# Patient Record
Sex: Male | Born: 1965 | Race: White | Hispanic: No | Marital: Married | Smoking: Never smoker
Health system: Southern US, Community
[De-identification: ages and names within clinical notes are randomized; demographics above are authoritative.]

## PROBLEM LIST (undated history)

## (undated) DIAGNOSIS — I82409 Acute embolism and thrombosis of unspecified deep veins of unspecified lower extremity: Secondary | ICD-10-CM

## (undated) DIAGNOSIS — G8929 Other chronic pain: Secondary | ICD-10-CM

## (undated) DIAGNOSIS — J690 Pneumonitis due to inhalation of food and vomit: Secondary | ICD-10-CM

## (undated) DIAGNOSIS — N200 Calculus of kidney: Secondary | ICD-10-CM

## (undated) DIAGNOSIS — I2699 Other pulmonary embolism without acute cor pulmonale: Secondary | ICD-10-CM

## (undated) HISTORY — PX: ABDOMINAL SURGERY: SHX537

## (undated) HISTORY — PX: KIDNEY STONE SURGERY: SHX686

## (undated) HISTORY — PX: GASTRIC BYPASS: SHX52

## (undated) HISTORY — PX: KNEE SURGERY: SHX244

---

## 1999-09-19 ENCOUNTER — Encounter: Payer: Self-pay | Admitting: Family Medicine

## 1999-09-19 ENCOUNTER — Encounter: Admission: RE | Admit: 1999-09-19 | Discharge: 1999-09-19 | Payer: Self-pay | Admitting: Family Medicine

## 1999-09-28 ENCOUNTER — Encounter: Admission: RE | Admit: 1999-09-28 | Discharge: 1999-09-28 | Payer: Self-pay | Admitting: Family Medicine

## 1999-09-28 ENCOUNTER — Encounter: Payer: Self-pay | Admitting: Family Medicine

## 2008-05-11 ENCOUNTER — Emergency Department (HOSPITAL_BASED_OUTPATIENT_CLINIC_OR_DEPARTMENT_OTHER): Admission: EM | Admit: 2008-05-11 | Discharge: 2008-05-11 | Payer: Self-pay | Admitting: Emergency Medicine
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
## 2010-01-06 ENCOUNTER — Emergency Department (HOSPITAL_BASED_OUTPATIENT_CLINIC_OR_DEPARTMENT_OTHER): Admission: EM | Admit: 2010-01-06 | Discharge: 2010-01-07 | Payer: Self-pay | Admitting: Emergency Medicine

## 2010-01-06 IMAGING — CT CT ABD-PELV W/ CM
2 of 5 series · 16 of 46 positions shown, 18 images · IV contrast (omnipaque)
Comparison: Today's plain films.

CLINICAL DATA: Abdominal pain, vomiting.

CT ABDOMEN AND PELVIS WITH CONTRAST
TECHNIQUE: Multidetector CT imaging of the abdomen and pelvis was
performed following the standard protocol during bolus
administration of intravenous contrast.
Contrast: 100 ml Omnipaque 300 IV.

[Series 2: abd/pelvis 5.0 b31f · axial · 0.81mm/px · z∈[-500,-40]mm · 13 of 106 slices shown, 15 images]
[im 7/106  soft-tissue]
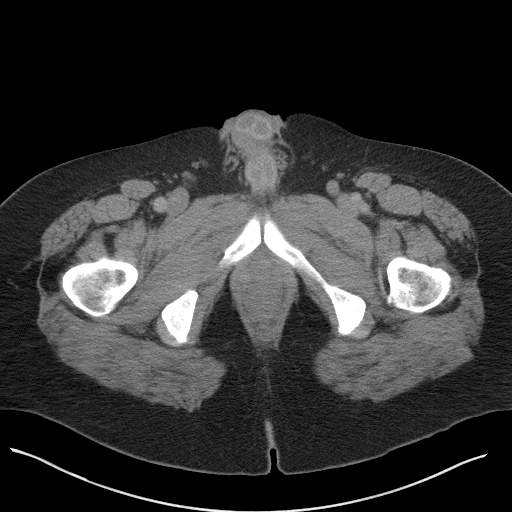
[im 7/106  bone]
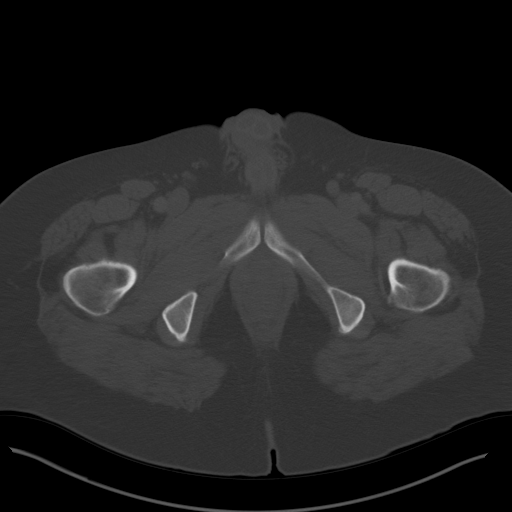
[im 13/106  soft-tissue]
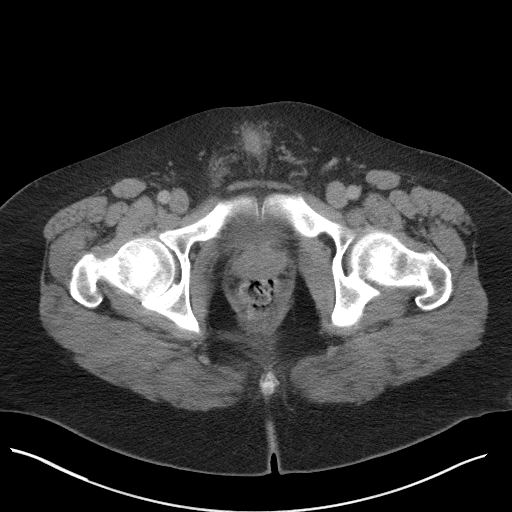
[im 25/106  soft-tissue]
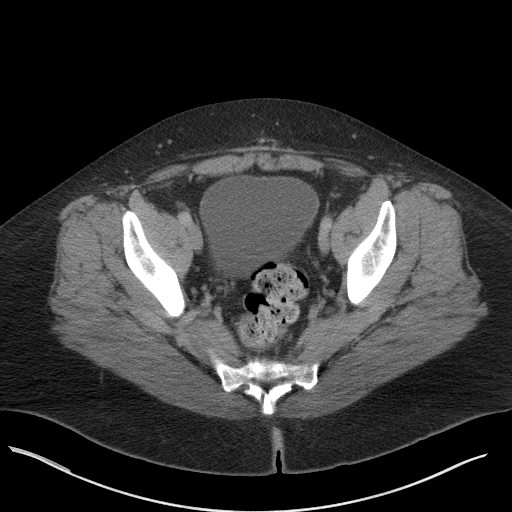
[im 31/106  soft-tissue]
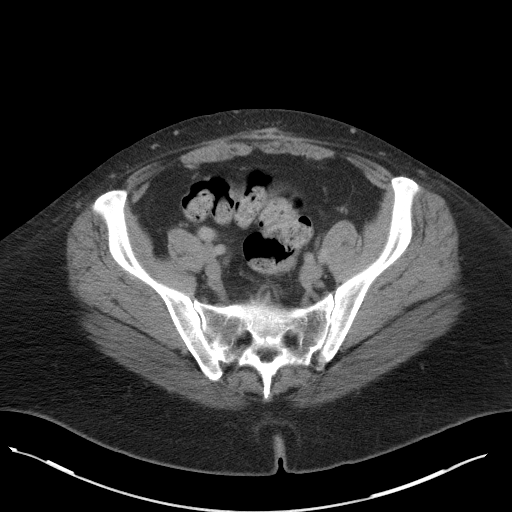
[im 38/106  soft-tissue]
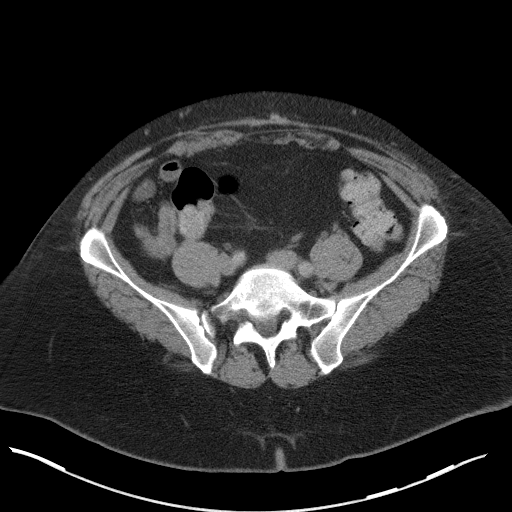
[im 44/106  soft-tissue]
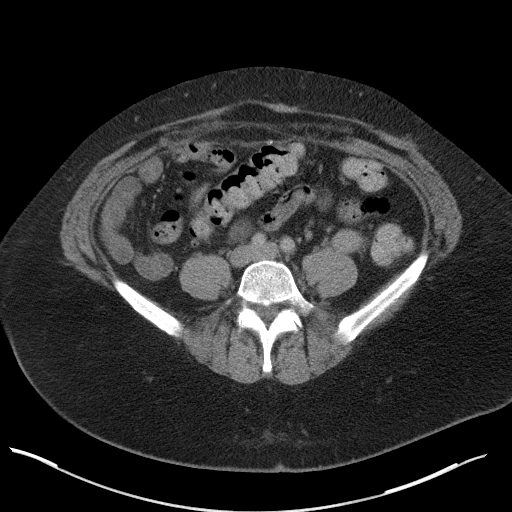
[im 56/106  soft-tissue]
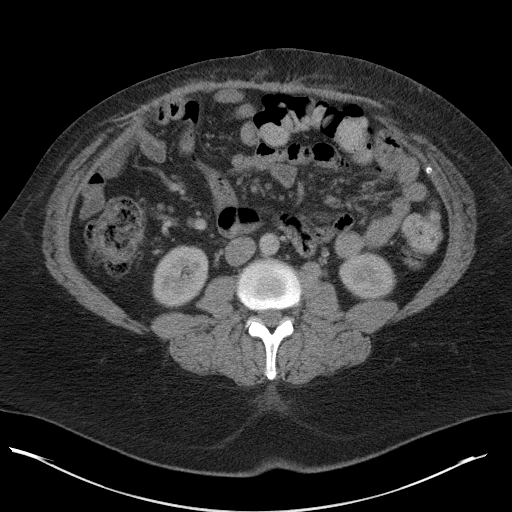
[im 62/106  soft-tissue]
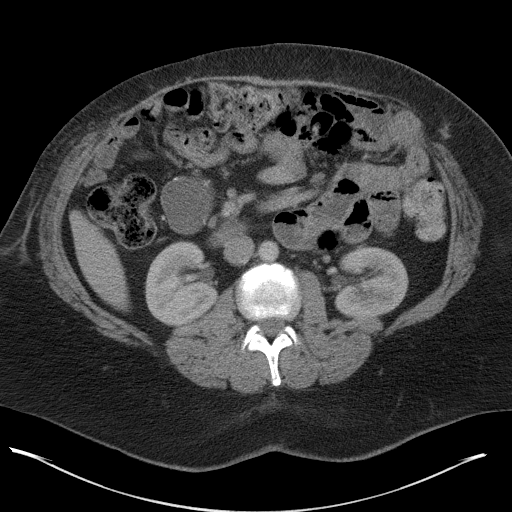
[im 68/106  soft-tissue]
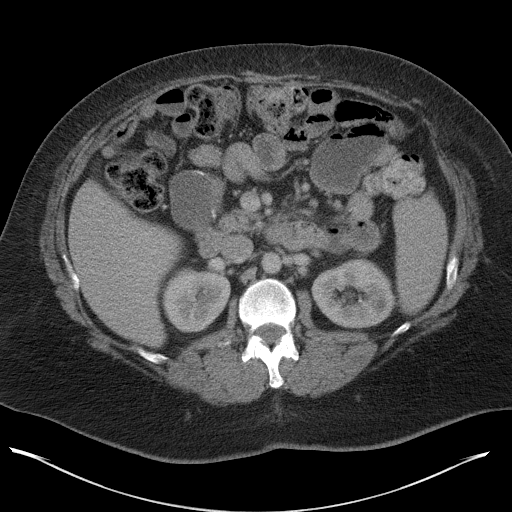
[im 68/106  bone]
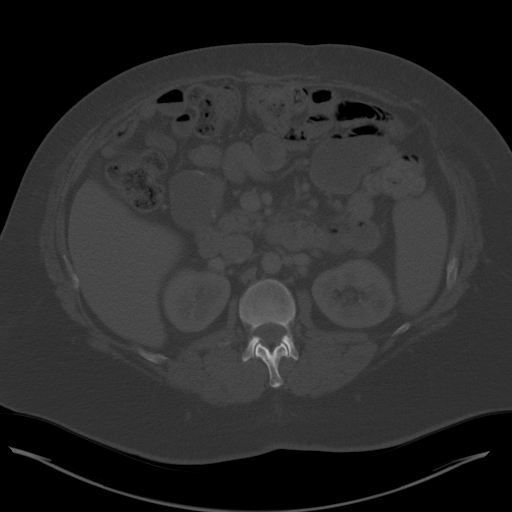
[im 75/106  soft-tissue]
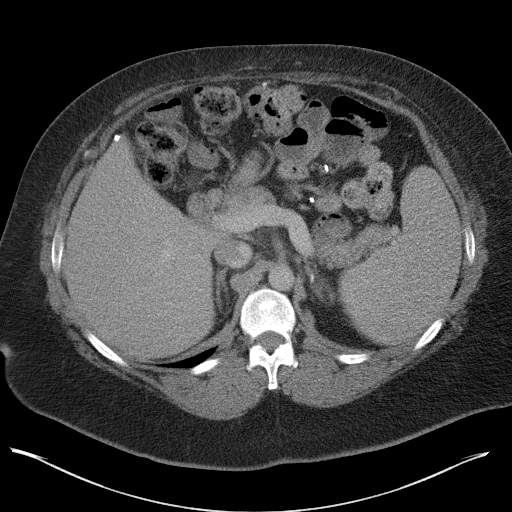
[im 81/106  soft-tissue]
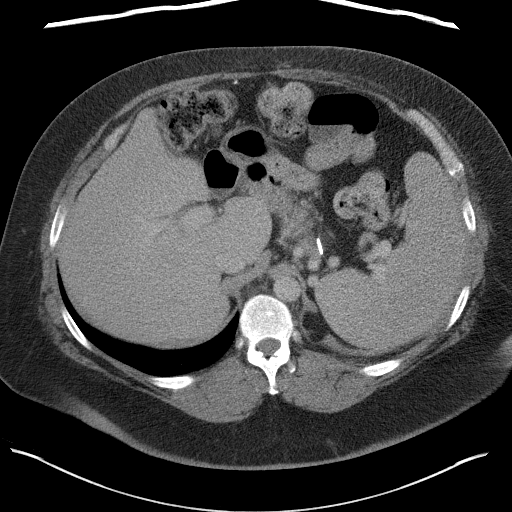
[im 93/106  soft-tissue]
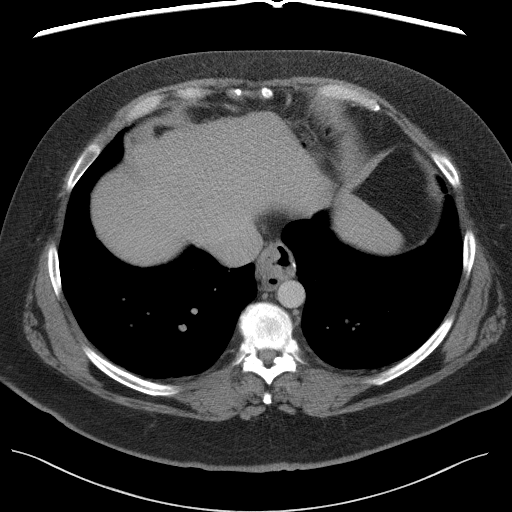
[im 99/106  soft-tissue]
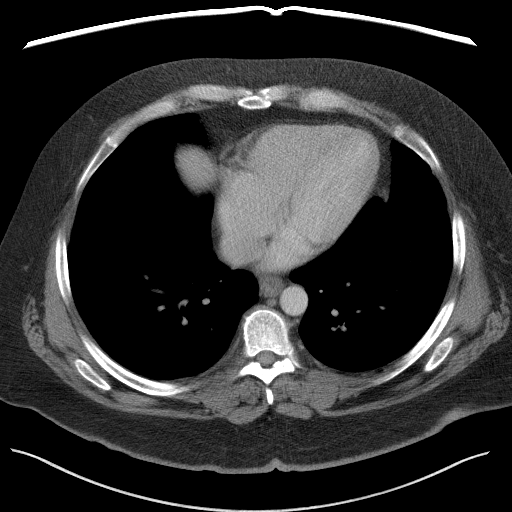

[Series 5: abd/pelvis 3.0 coronal · coronal · 0.91mm/px · 3 of 103 slices shown]
[im 35/103  soft-tissue]
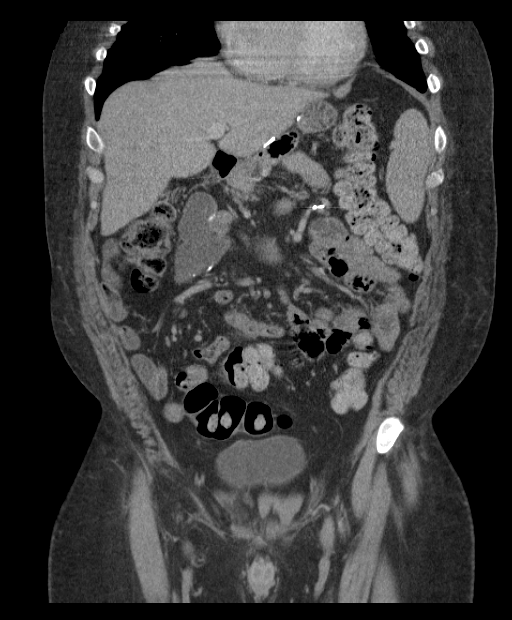
[im 46/103  soft-tissue]
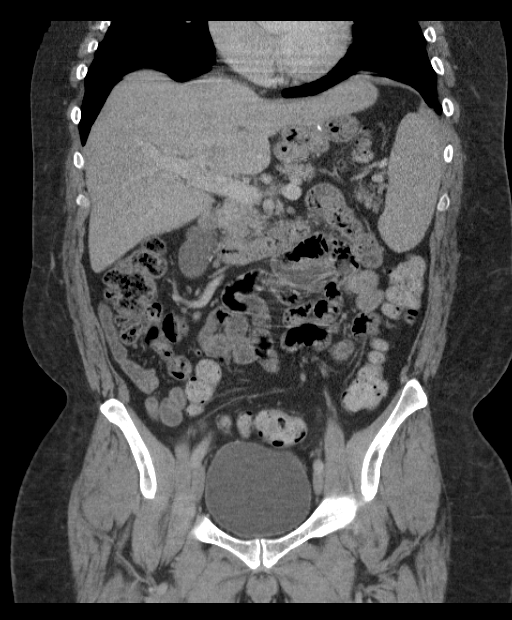
[im 57/103  soft-tissue]
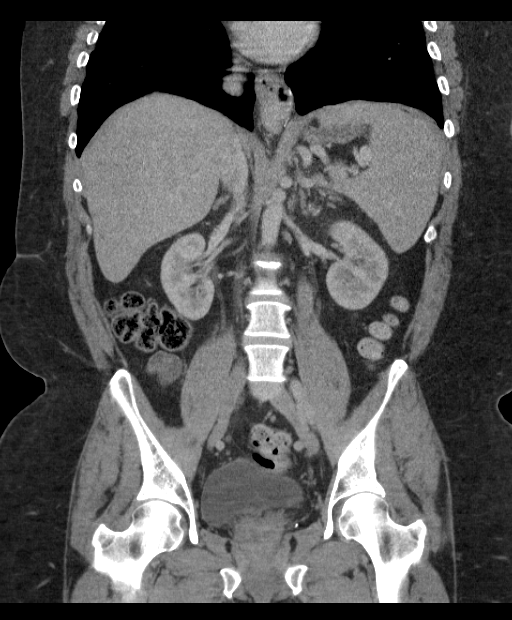

[16 of 46 positions shown; findings below may reference images not displayed]

FINDINGS: Patchy ground-glass and tree in bud densities are noted
in the lower lobes may reflect mild small airways
disease/alveolitis.  No effusions.  Heart is normal size.

Abdomen:  Prior cholecystectomy.  Postoperative changes noted in
the small bowel as well.  There is a suture line in the right
abdomen within a small bowel loop which is mildly dilated, but no
evidence of obstruction.  Liver, spleen, pancreas, adrenals,
kidneys unremarkable.  No free fluid, free air or adenopathy.

Pelvis:  Appendix is visualized and is normal. Bowel grossly
unremarkable.  No free fluid, free air, or adenopathy. Bladder,
seminal vesicles, prostate unremarkable.

No acute bony abnormality.
IMPRESSION: No acute findings in the abdomen or pelvis.

Patchy ground-glass and tree in bud densities in the lower lobes
which may reflect small airways disease/alveolitis.

## 2010-01-06 IMAGING — CR DG ABDOMEN ACUTE W/ 1V CHEST
5 series · 5 of 5 positions shown · non-contrast
Comparison: None

CLINICAL DATA: Abdominal pain, vomiting.

ACUTE ABDOMEN SERIES (ABDOMEN 2 VIEW & CHEST 1 VIEW)

[w chest pa *]
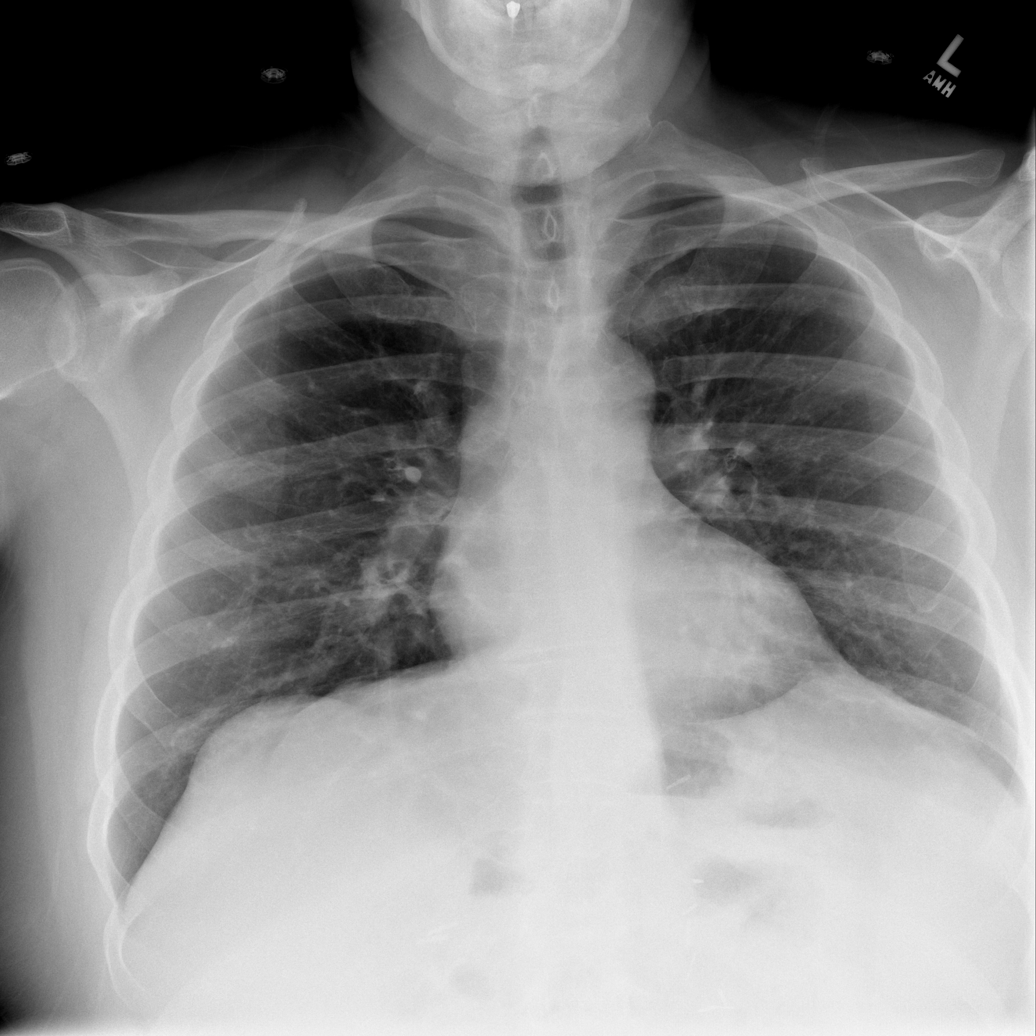

[w abdomen upright * (1 of 2)]
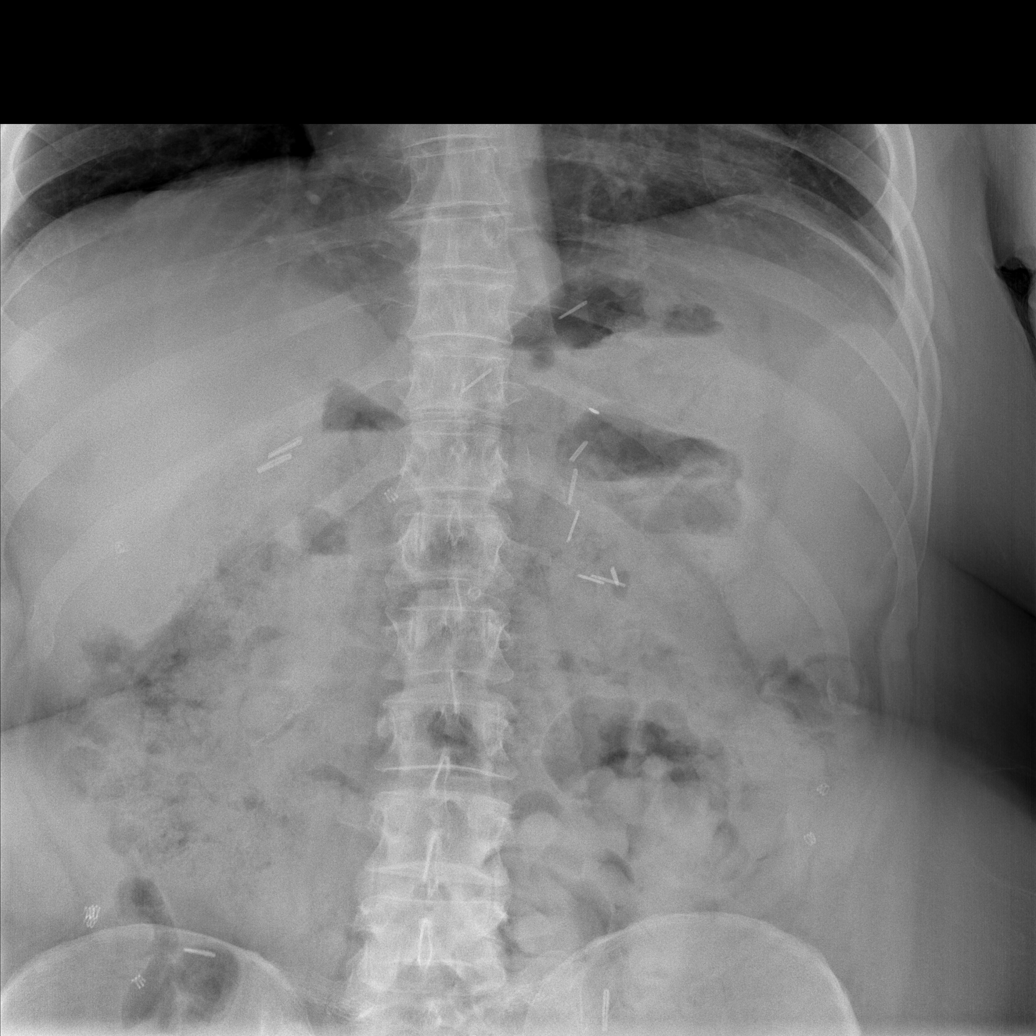

[w abdomen upright * (2 of 2)]
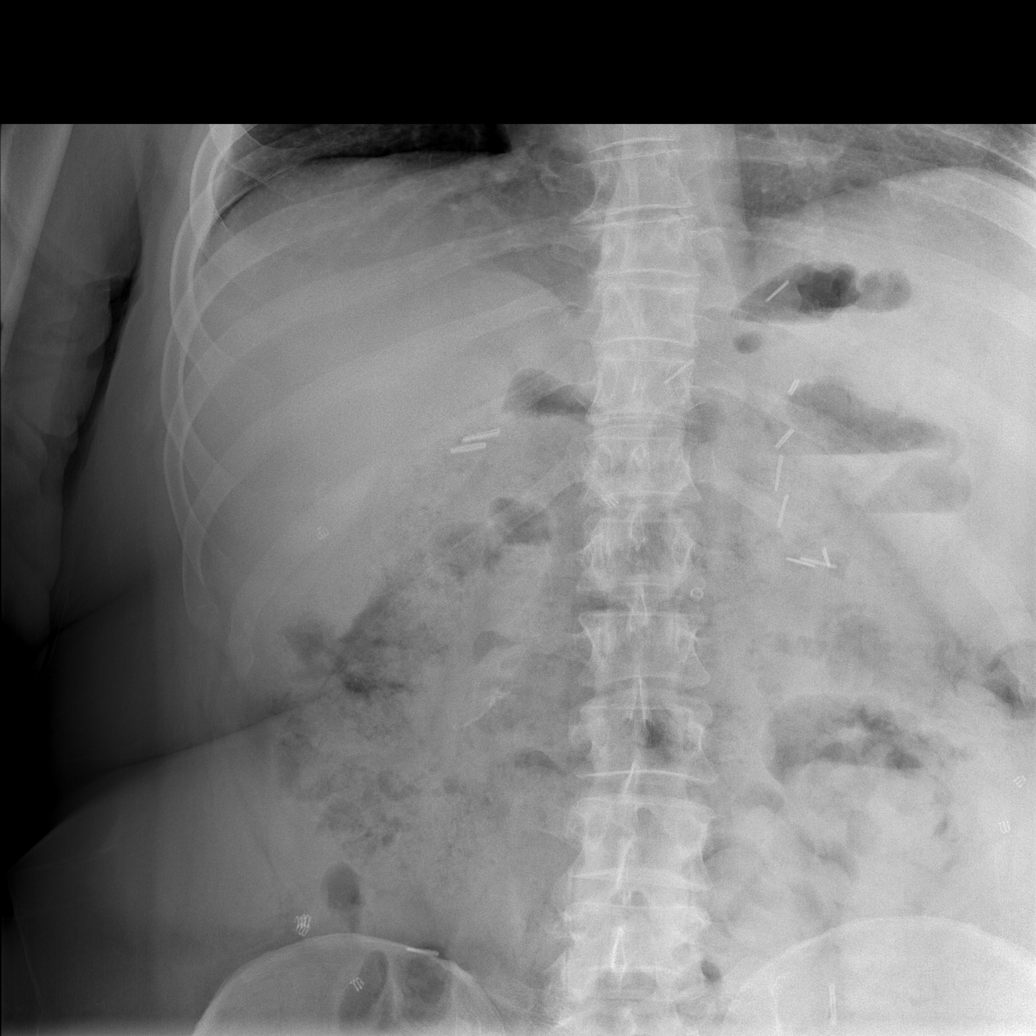

[t abdomen supine * (1 of 2)]
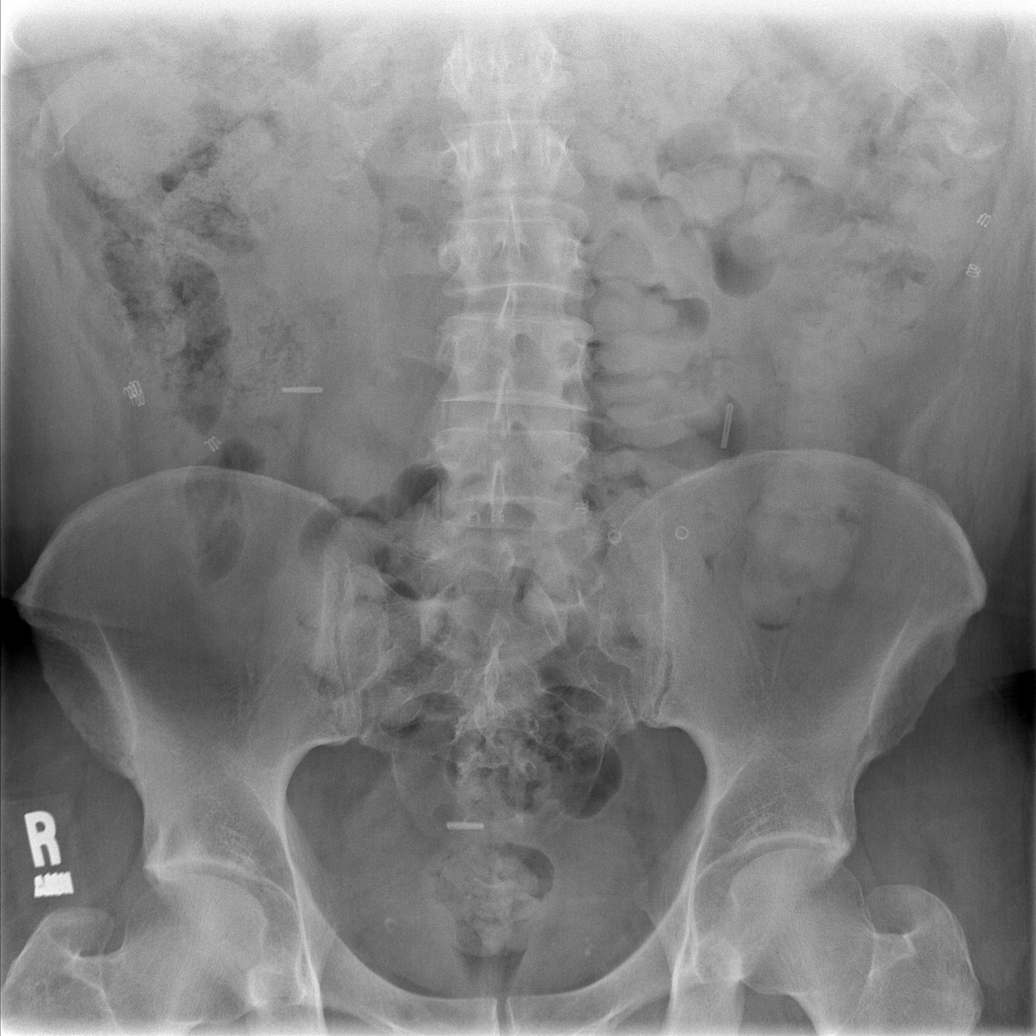

[t abdomen supine * (2 of 2)]
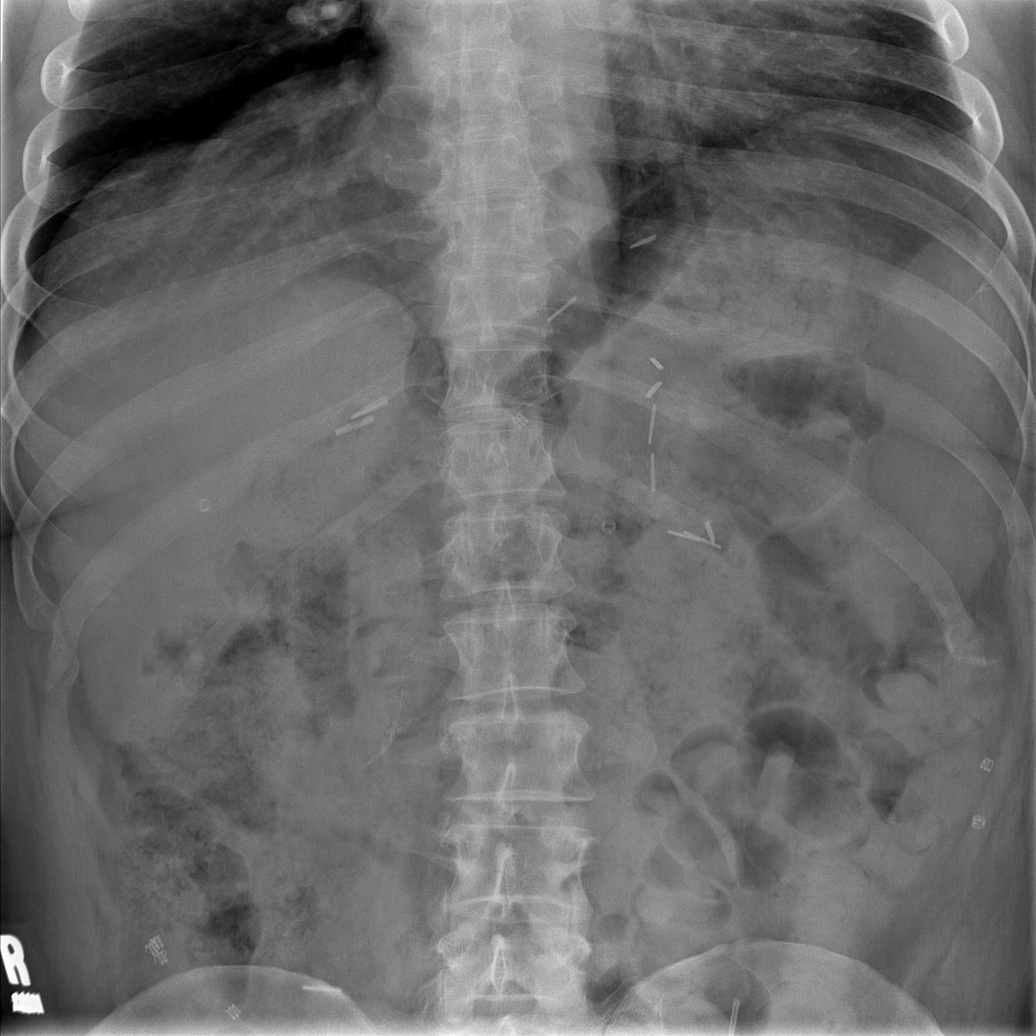

[5 of 5 positions shown; findings below may reference images not displayed]

FINDINGS: Mild peribronchial thickening.  No confluent opacities or
effusions.  Heart is normal size.

Moderate stool burden throughout the colon.  No obstruction or free
air.  Surgical clips throughout the abdomen and pelvis.  No acute
bony abnormality.
IMPRESSION: No acute findings in the abdomen.

Mild peribronchial thickening.

## 2010-01-07 ENCOUNTER — Ambulatory Visit: Payer: Self-pay | Admitting: Diagnostic Radiology

## 2010-09-08 ENCOUNTER — Other Ambulatory Visit: Admission: RE | Admit: 2010-09-08 | Discharge: 2010-09-08 | Payer: Self-pay | Admitting: Pulmonary Disease

## 2011-02-15 LAB — CBC
HCT: 42.2 % (ref 39.0–52.0)
Hemoglobin: 14.5 g/dL (ref 13.0–17.0)
MCHC: 34.4 g/dL (ref 30.0–36.0)
RDW: 12.6 % (ref 11.5–15.5)

## 2011-02-15 LAB — DIFFERENTIAL
Basophils Relative: 1 % (ref 0–1)
Monocytes Relative: 10 % (ref 3–12)
Neutro Abs: 3.8 10*3/uL (ref 1.7–7.7)
Neutrophils Relative %: 65 % (ref 43–77)

## 2011-02-15 LAB — COMPREHENSIVE METABOLIC PANEL
Alkaline Phosphatase: 65 U/L (ref 39–117)
BUN: 16 mg/dL (ref 6–23)
Calcium: 9.2 mg/dL (ref 8.4–10.5)
GFR calc non Af Amer: >60 mL/min (ref >60)
Glucose, Bld: 94 mg/dL (ref 70–99)
Total Protein: 7.1 g/dL (ref 6.0–8.3)

## 2011-02-15 LAB — URINALYSIS, ROUTINE W REFLEX MICROSCOPIC
Bilirubin Urine: NEGATIVE
Glucose, UA: NEGATIVE mg/dL
Hgb urine dipstick: NEGATIVE
Ketones, ur: NEGATIVE mg/dL
Nitrite: NEGATIVE
Protein, ur: NEGATIVE mg/dL
Specific Gravity, Urine: 1.006 (ref 1.005–1.030)
Urobilinogen, UA: 0.2 mg/dL (ref 0.0–1.0)
pH: 6 (ref 5.0–8.0)

## 2011-02-15 LAB — LIPASE, BLOOD: Lipase: 136 U/L (ref 23–300)

## 2018-02-04 ENCOUNTER — Other Ambulatory Visit: Payer: Self-pay

## 2018-02-04 ENCOUNTER — Emergency Department (INDEPENDENT_AMBULATORY_CARE_PROVIDER_SITE_OTHER): Payer: BLUE CROSS/BLUE SHIELD

## 2018-02-04 ENCOUNTER — Emergency Department
Admission: EM | Admit: 2018-02-04 | Discharge: 2018-02-04 | Disposition: A | Discharge status: Home / Self Care | Payer: BLUE CROSS/BLUE SHIELD | Source: Home / Self Care

## 2018-02-04 DIAGNOSIS — S99922A Unspecified injury of left foot, initial encounter: Secondary | ICD-10-CM

## 2018-02-04 DIAGNOSIS — W208XXA Other cause of strike by thrown, projected or falling object, initial encounter: Secondary | ICD-10-CM

## 2018-02-04 DIAGNOSIS — S91109A Unspecified open wound of unspecified toe(s) without damage to nail, initial encounter: Secondary | ICD-10-CM

## 2018-02-04 DIAGNOSIS — S5000XA Contusion of unspecified elbow, initial encounter: Secondary | ICD-10-CM

## 2018-02-04 DIAGNOSIS — M25522 Pain in left elbow: Secondary | ICD-10-CM

## 2018-02-04 DIAGNOSIS — Z23 Encounter for immunization: Secondary | ICD-10-CM

## 2018-02-04 HISTORY — DX: Other pulmonary embolism without acute cor pulmonale: I26.99

## 2018-02-04 HISTORY — DX: Acute embolism and thrombosis of unspecified deep veins of unspecified lower extremity: I82.409

## 2018-02-04 IMAGING — DX DG ELBOW COMPLETE 3+V*L*
4 series · 4 of 4 positions shown · non-contrast
Comparison: [DATE]

CLINICAL DATA: Recent fall with left elbow pain, initial encounter

EXAM:
LEFT ELBOW - COMPLETE 3+ VIEW

[elbow ap]
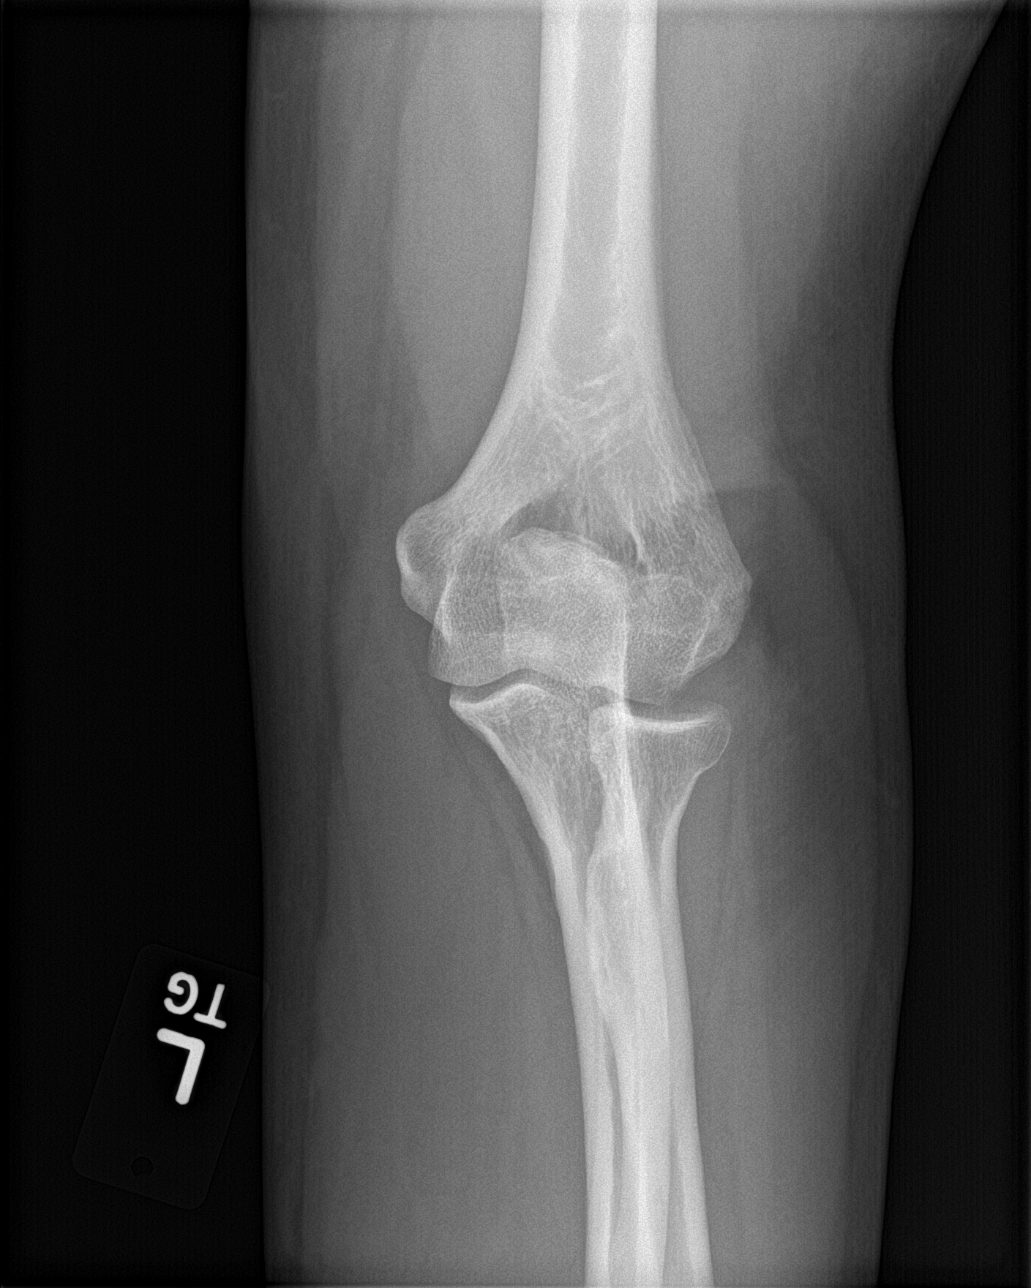

[elbow lat]
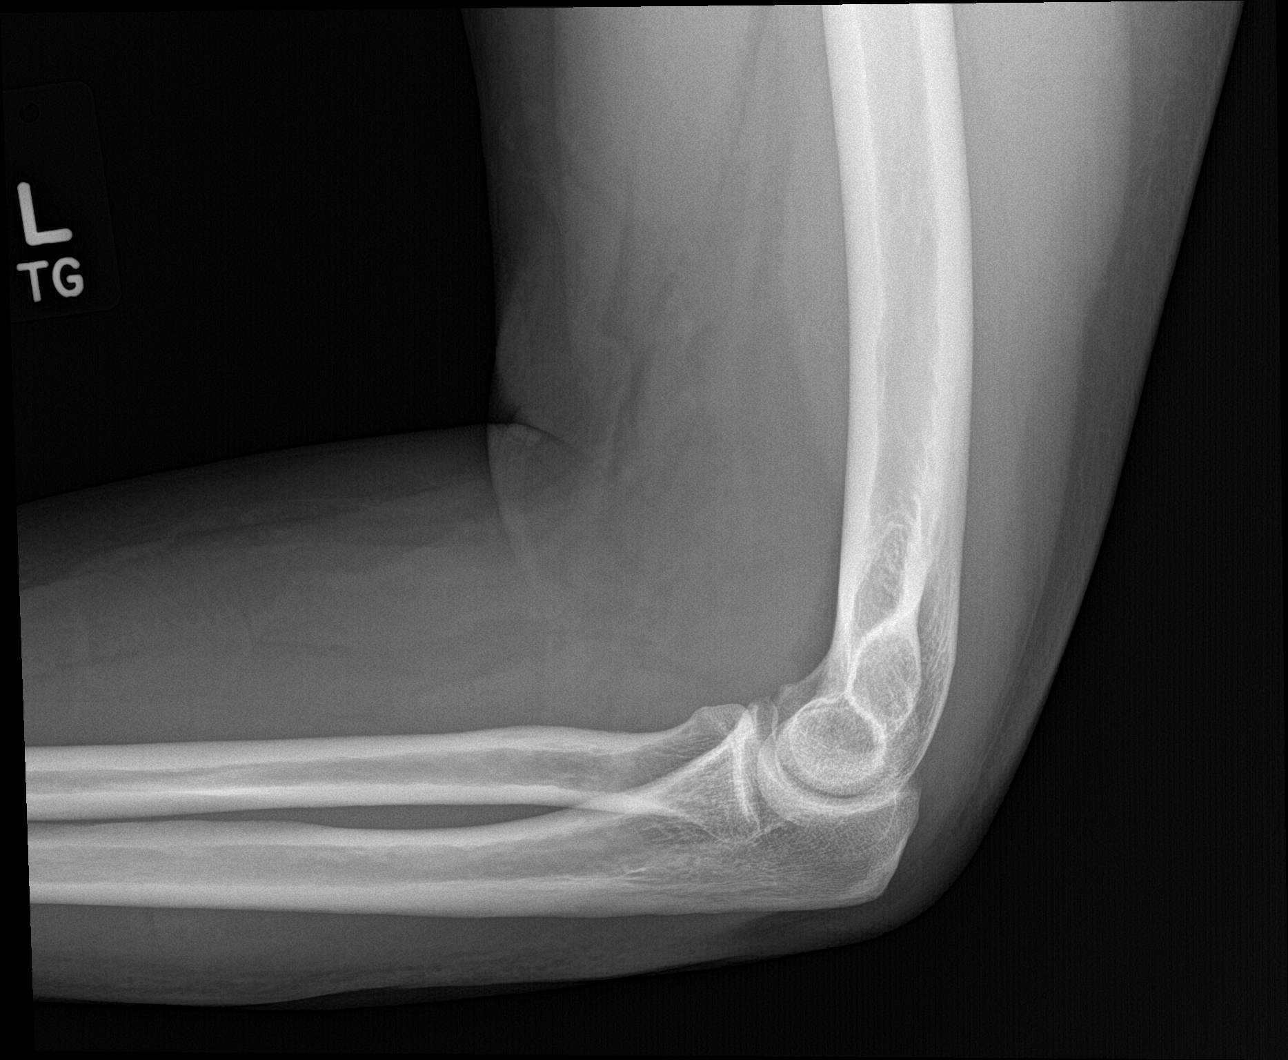

[elbow obl (1 of 2)]
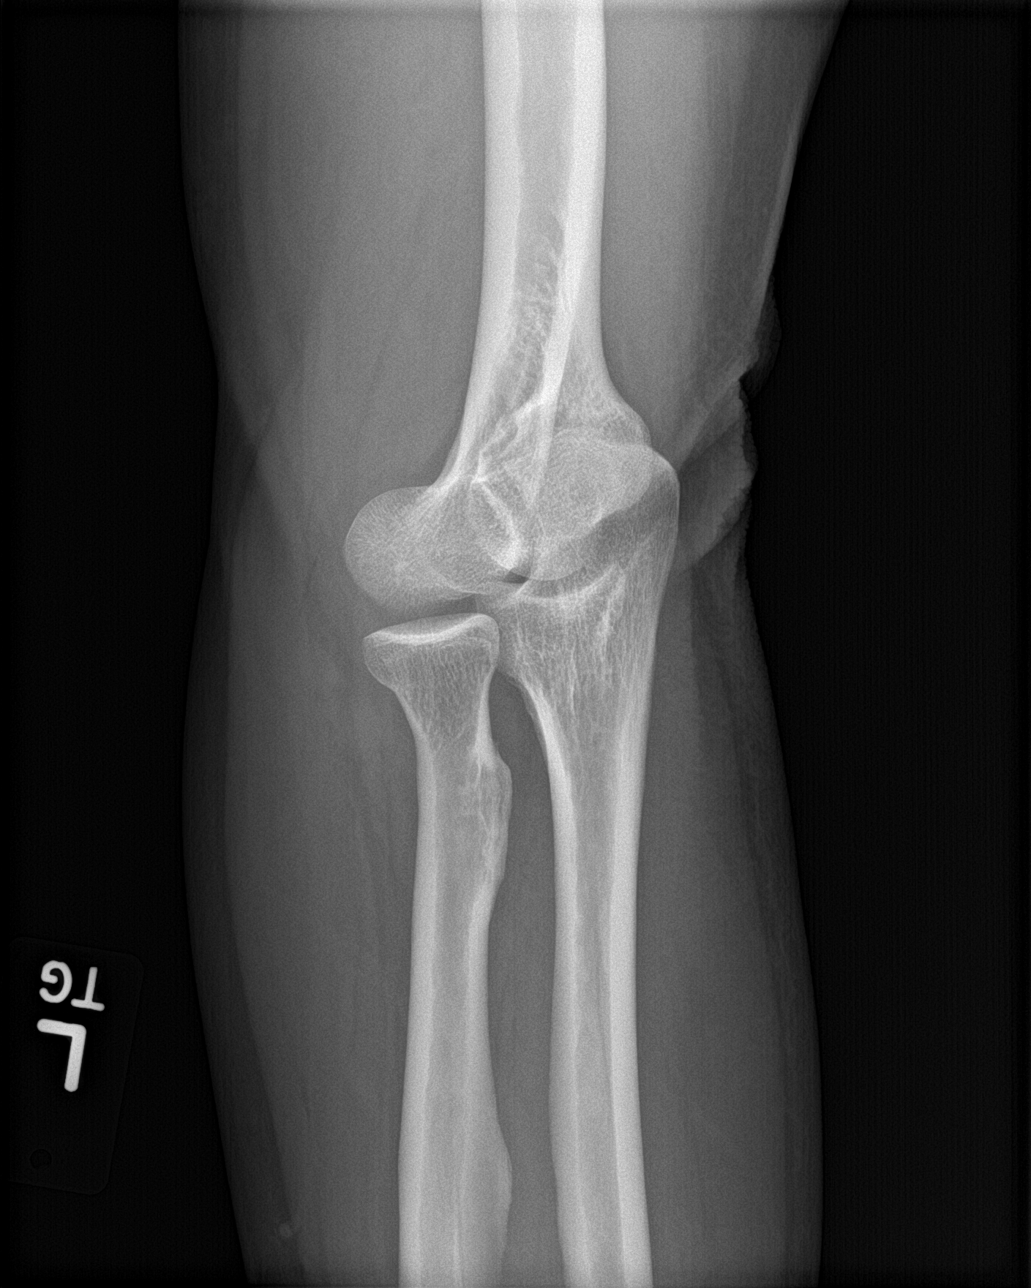

[elbow obl (2 of 2)]
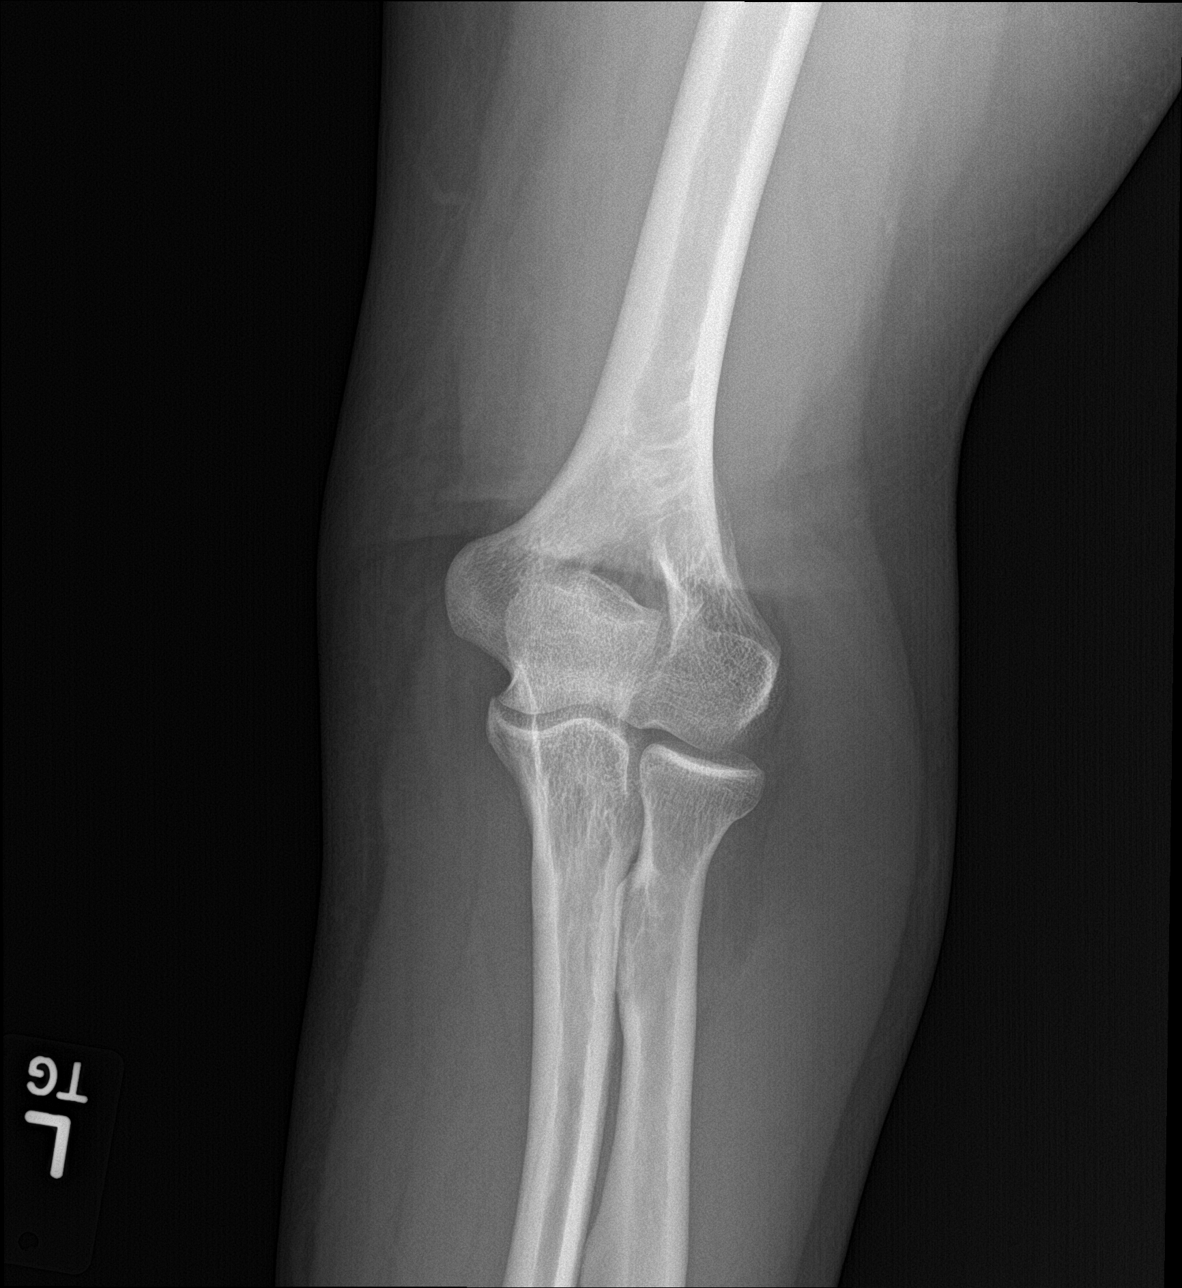

[4 of 4 positions shown; findings below may reference images not displayed]

FINDINGS: There is no evidence of fracture, dislocation, or joint effusion.
There is no evidence of arthropathy or other focal bone abnormality.
Soft tissues are unremarkable.
IMPRESSION: No acute abnormality noted.

## 2018-02-04 IMAGING — DX DG TOE GREAT 2+V*L*
3 series · 3 of 3 positions shown · non-contrast
Comparison: None.

CLINICAL DATA: Dropped heavy equipment on foot with first toe
injury, initial encounter

EXAM:
LEFT GREAT TOE

[toe ap]
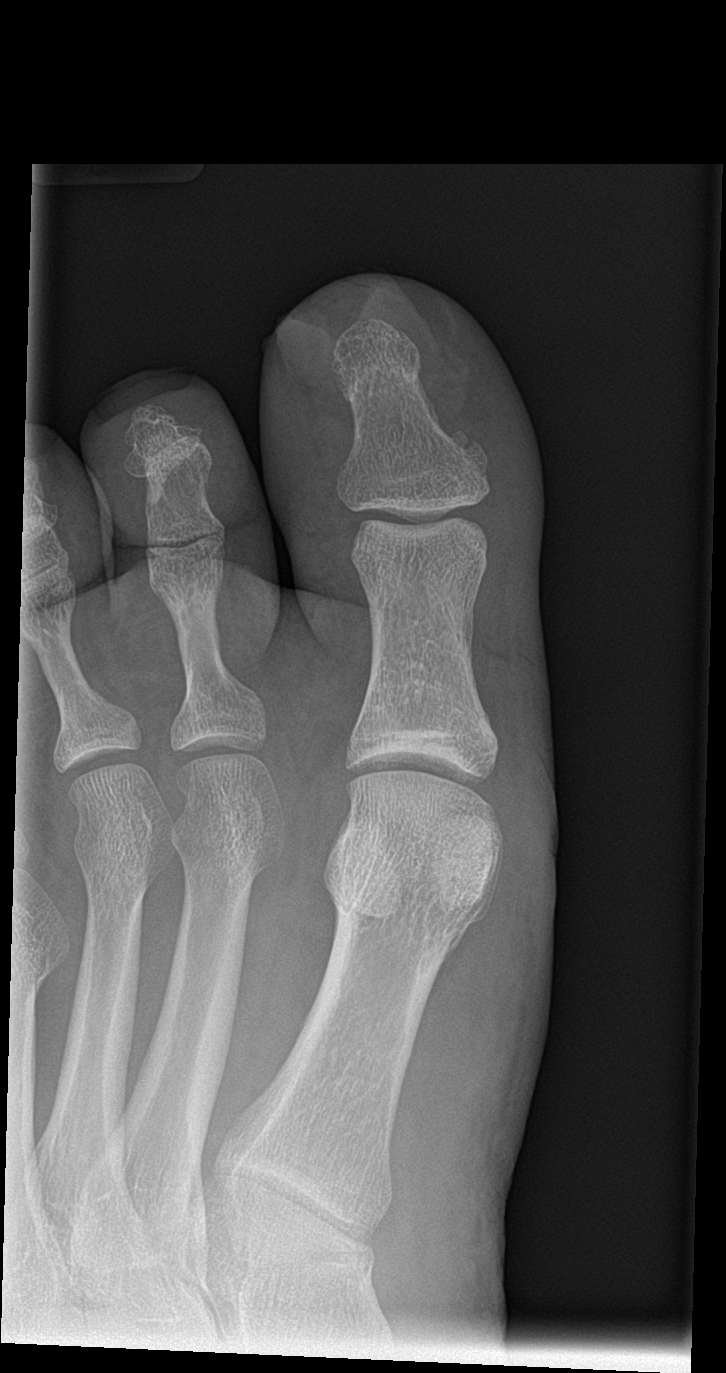

[toe obl]
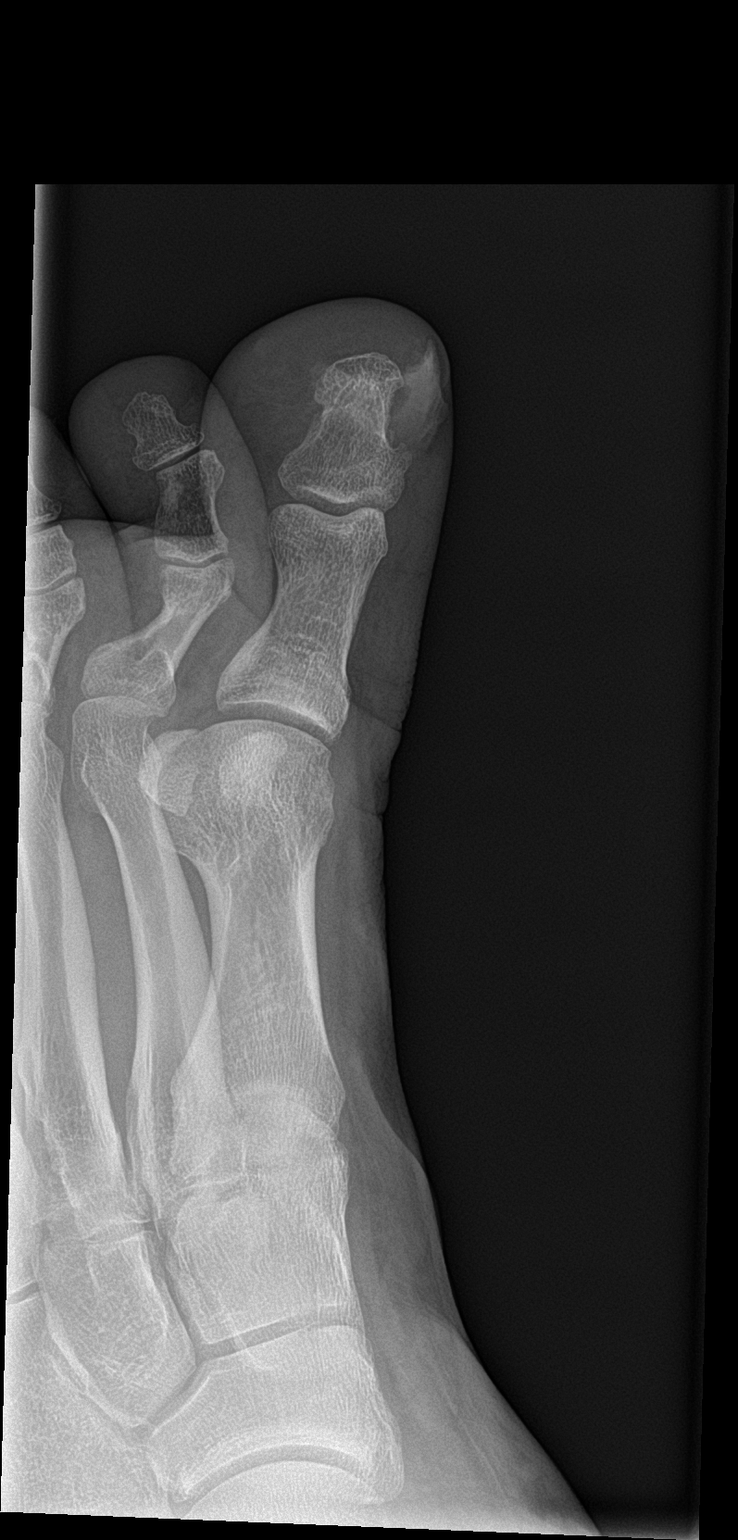

[toe lat]
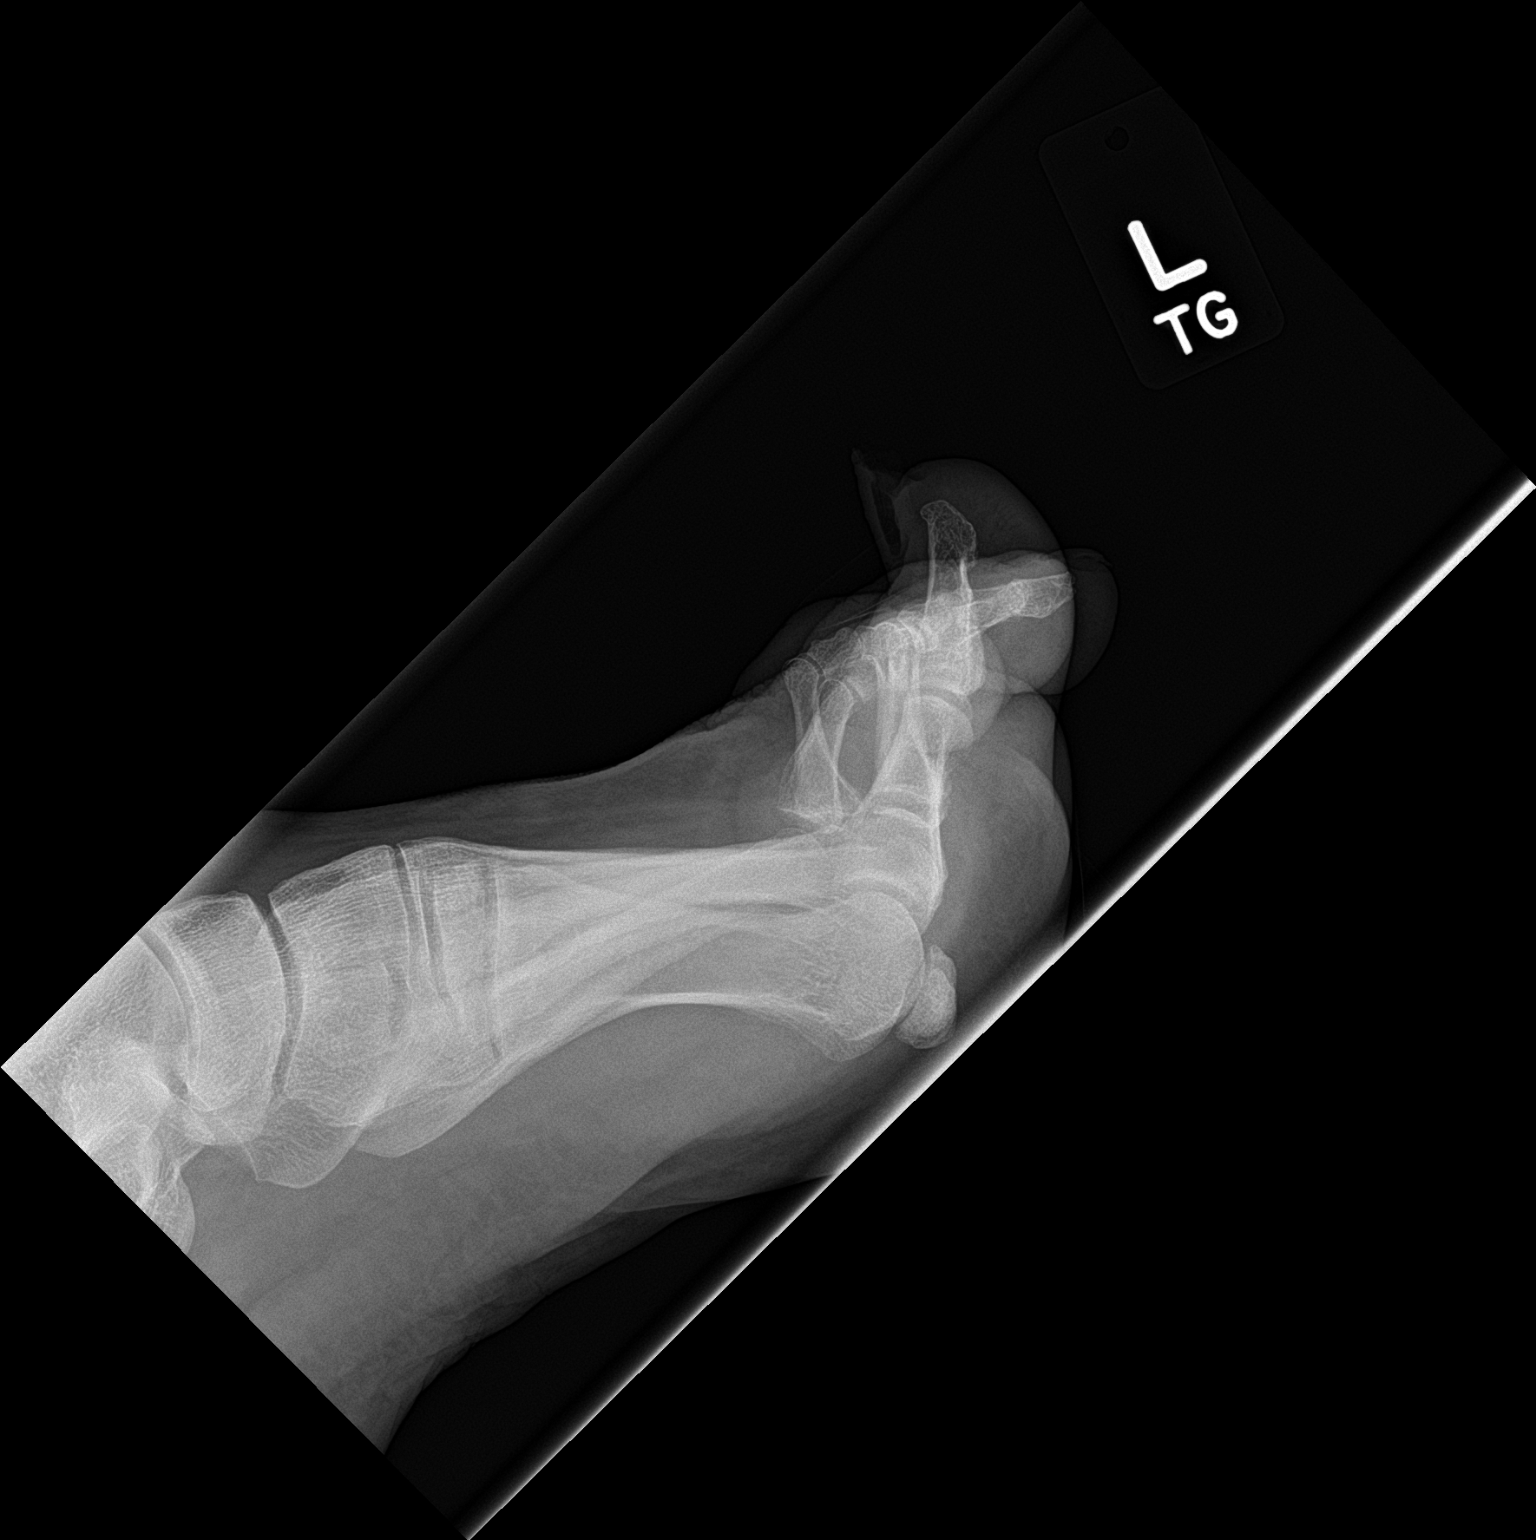

[3 of 3 positions shown; findings below may reference images not displayed]

FINDINGS: Irregularity of the first nail is seen. No soft tissue or bony
abnormality is noted. No other focal abnormality is seen.
IMPRESSION: No acute bony abnormality noted. Irregularity of the first nail is
noted likely related to the prior trauma.

## 2018-02-04 MED ORDER — HYDROCODONE-ACETAMINOPHEN 5-325 MG PO TABS: 1.0000 | ORAL_TABLET | ORAL | 0 refills | Status: AC | PRN

## 2018-02-04 MED ORDER — TETANUS-DIPHTH-ACELL PERTUSSIS 5-2.5-18.5 LF-MCG/0.5 IM SUSP
0.5000 mL | Freq: Once | INTRAMUSCULAR | Status: AC
  Administered 2018-02-04: 0.5 mL via INTRAMUSCULAR

## 2018-02-04 NOTE — ED Provider Notes (Signed)
[facility]    CSN: [id number] Arrival date & time: 02/04/18  1811     History   Chief Complaint Chief Complaint  Patient presents with  . Toe Injury  . Elbow Pain    HPI Parker Cisneros is a 52 y.o. male.  Patient was in his usual state of health until this afternoon when after he got home from work he was working on his trailer and a piece of equipment fell onto his  Left foot. He fell to the ground and also injured his left elbow.He is currently on Xarelto  and has had significant bleeding from his injured toe. HPI  Past Medical History:  Diagnosis Date  . DVT (deep venous thrombosis) (HCC)   . Pulmonary emboli (HCC)     There are no active problems to display for this patient.   History reviewed. No pertinent surgical history.     Home Medications    Prior to Admission medications   Medication Sig Start Date End Date Taking? Authorizing Provider  furosemide (LASIX) 20 MG tablet Take 20 mg by mouth.   Yes [provider]  methocarbamol (ROBAXIN) 750 MG tablet Take 750 mg by mouth 4 (four) times daily.   Yes [provider]  rivaroxaban (XARELTO) 20 MG TABS tablet Take 20 mg by mouth daily with supper.   Yes [provider]    Family History History reviewed. No pertinent family history.  Social History Social History   Tobacco Use  . Smoking status: Never Smoker  . Smokeless tobacco: Never Used  Substance Use Topics  . Alcohol use: No    Frequency: Never  . Drug use: No     Allergies   Patient has no known allergies.   Review of Systems Review of Systems  Musculoskeletal:       Patient complaining of pain with movement of the left elbow. He has a history of ten He also has significant pain in discomfort in the left great toe associated with bleeding from the nail.     Physical Exam Triage Vital Signs ED Triage Vitals  Enc Vitals Group     BP 02/04/18 1858 (!) 148/104     Pulse Rate 02/04/18 1858  96     Resp --      Temp 02/04/18 1858 (!) 97.4 F (36.3 C)     Temp Source 02/04/18 1858 Oral     SpO2 02/04/18 1858 97 %     Weight 02/04/18 1859 (!) 338 lb (153.3 kg)     Height 02/04/18 1859 6' (1.829 m)     Head Circumference --      Peak Flow --      Pain Score 02/04/18 1859 3     Pain Loc --      Pain Edu? --      Excl. in GC? --    No data found.  Updated Vital Signs BP (!) 148/104 (BP Location: Right Arm)   Pulse 96   Temp (!) 97.4 F (36.3 C) (Oral)   Ht 6' (1.829 m)   Wt (!) 338 lb (153.3 kg)   SpO2 97%   BMI 45.84 kg/m   Visual Acuity Right Eye Distance:   Left Eye Distance:   Bilateral Distance:    Right Eye Near:   Left Eye Near:    Bilateral Near:     Physical Exam  Constitutional: He appears well-developed and well-nourished.  Musculoskeletal:  There is full range of motion  of the left elbow. There are no areas of tenderness.There is near-complete avulsion of the left great nail. There is active bleeding from this area.     UC Treatments / Results  Labs (all labs ordered are listed, but only abnormal results are displayed) Labs Reviewed - No data to display  EKG  EKG Interpretation None       Radiology Dg Elbow Complete Left  Result Date: 02/04/2018 CLINICAL DATA:  Recent fall with left elbow pain, initial encounter EXAM: LEFT ELBOW - COMPLETE 3+ VIEW COMPARISON:  05/11/2008 FINDINGS: There is no evidence of fracture, dislocation, or joint effusion. There is no evidence of arthropathy or other focal bone abnormality. Soft tissues are unremarkable. IMPRESSION: No acute abnormality noted. Electronically Signed   By: Alcide Clever M.D.   On: 02/04/2018 19:29   Dg Toe Great Left  Result Date: 02/04/2018 CLINICAL DATA:  Dropped heavy equipment on foot with first toe injury, initial encounter EXAM: LEFT GREAT TOE COMPARISON:  None. FINDINGS: Irregularity of the first nail is seen. No soft tissue or bony abnormality is noted. No other focal  abnormality is seen. IMPRESSION: No acute bony abnormality noted. Irregularity of the first nail is noted likely related to the prior trauma. Electronically Signed   By: Alcide Clever M.D.   On: 02/04/2018 19:27    Procedures Procedures (including critical care time) Procedure note The base of the nail plate was cleaned with Betadine. The area was swabbed with alcohol. 2 mL of 2% plain was injected at the base of the nail plate. Following this a hemostat was used to remove the nail plate. There was no active bleeding. Xeroform was placed over the nail matrix. Patient  Tolerated the procedure well. Medications Ordered in UC Medications  Tdap (BOOSTRIX) injection 0.5 mL (not administered)     Initial Impression / Assessment and Plan / UC Course  I have reviewed the triage vital signs and the nursing notes.  Pertinent labs & imaging results that were available during my care of the patient were reviewed by me and considered in my medical decision making (see chart for details). This patient has had multiple surgical procedures. He was in pain management until 2 years ago. He has not been on pain medication for those 2 years.I gave him #15 hydrocodone. I confirmed on PMP aware he has not been on pain medications since 2017. He will either follow up here or wth his PCP on Friday.      Final Clinical Impressions(s) / UC Diagnoses   Final diagnoses:  Open wound of toe, initial encounter  Contusion of elbow, unspecified laterality, initial encounter    ED Discharge Orders    None     Check circulation to the toe 3 times a day. Wear your postop shoe. See your family doctor in 3 days. Take medication every 4-6 hours as needed.  Controlled Substance Prescriptions [place] Controlled Substance Registry consulted? Yes, I have consulted the [place] Controlled Substances Registry for this patient, and feel the risk/benefit ratio today is favorable for proceeding with this prescription for a controlled  substance.   Collene Gobble, MD 02/04/18 [phone]

## 2018-02-04 NOTE — Discharge Instructions (Signed)
Check circulation to the toe 3 times a day. Wear your postop shoe. See your family doctor in 3 days. Take medication every 4-6 hours as needed.

## 2018-02-04 NOTE — ED Triage Notes (Signed)
Pt was taking a piece of equipment off a trailer, and it came off too fast and fell on his left great toe.  He fell in the process on his left elbow.

## 2021-03-29 ENCOUNTER — Other Ambulatory Visit: Payer: Self-pay | Admitting: Orthopedic Surgery

## 2021-03-29 DIAGNOSIS — M5412 Radiculopathy, cervical region: Secondary | ICD-10-CM

## 2021-03-29 DIAGNOSIS — M5416 Radiculopathy, lumbar region: Secondary | ICD-10-CM

## 2021-04-04 ENCOUNTER — Other Ambulatory Visit (HOSPITAL_BASED_OUTPATIENT_CLINIC_OR_DEPARTMENT_OTHER): Payer: Self-pay

## 2021-04-04 ENCOUNTER — Other Ambulatory Visit: Payer: Self-pay

## 2021-04-04 ENCOUNTER — Emergency Department (HOSPITAL_BASED_OUTPATIENT_CLINIC_OR_DEPARTMENT_OTHER)
Admission: EM | Admit: 2021-04-04 | Discharge: 2021-04-04 | Disposition: A | Discharge status: Home / Self Care | Payer: BC Managed Care – PPO | Attending: Emergency Medicine | Admitting: Emergency Medicine

## 2021-04-04 ENCOUNTER — Encounter (HOSPITAL_BASED_OUTPATIENT_CLINIC_OR_DEPARTMENT_OTHER): Payer: Self-pay

## 2021-04-04 DIAGNOSIS — Z7901 Long term (current) use of anticoagulants: Secondary | ICD-10-CM | POA: Diagnosis not present

## 2021-04-04 DIAGNOSIS — L03113 Cellulitis of right upper limb: Secondary | ICD-10-CM | POA: Diagnosis not present

## 2021-04-04 DIAGNOSIS — R2231 Localized swelling, mass and lump, right upper limb: Secondary | ICD-10-CM | POA: Diagnosis present

## 2021-04-04 DIAGNOSIS — R000 Tachycardia, unspecified: Secondary | ICD-10-CM | POA: Diagnosis not present

## 2021-04-04 HISTORY — DX: Calculus of kidney: N20.0

## 2021-04-04 HISTORY — DX: Pneumonitis due to inhalation of food and vomit: J69.0

## 2021-04-04 HISTORY — DX: Other chronic pain: G89.29

## 2021-04-04 LAB — CBC WITH DIFFERENTIAL/PLATELET
Abs Immature Granulocytes: 0.08 10*3/uL — ABNORMAL HIGH (ref 0.00–0.07)
Basophils Absolute: 0 10*3/uL (ref 0.0–0.1)
Basophils Relative: 0 %
Eosinophils Absolute: 0.1 10*3/uL (ref 0.0–0.5)
Eosinophils Relative: 1 %
HCT: 47.6 % (ref 39.0–52.0)
Hemoglobin: 16 g/dL (ref 13.0–17.0)
Immature Granulocytes: 1 %
Lymphocytes Relative: 9 %
Lymphs Abs: 1.5 10*3/uL (ref 0.7–4.0)
MCH: 29.8 pg (ref 26.0–34.0)
MCHC: 33.6 g/dL (ref 30.0–36.0)
MCV: 88.6 fL (ref 80.0–100.0)
Monocytes Absolute: 1.5 10*3/uL — ABNORMAL HIGH (ref 0.1–1.0)
Monocytes Relative: 9 %
Neutro Abs: 13 10*3/uL — ABNORMAL HIGH (ref 1.7–7.7)
Neutrophils Relative %: 80 %
Platelets: 229 10*3/uL (ref 150–400)
RBC: 5.37 MIL/uL (ref 4.22–5.81)
RDW: 12.7 % (ref 11.5–15.5)
WBC: 16.2 10*3/uL — ABNORMAL HIGH (ref 4.0–10.5)
nRBC: 0 % (ref 0.0–0.2)

## 2021-04-04 LAB — COMPREHENSIVE METABOLIC PANEL
ALT: 25 U/L (ref 0–44)
AST: 26 U/L (ref 15–41)
Albumin: 3.9 g/dL (ref 3.5–5.0)
Alkaline Phosphatase: 55 U/L (ref 38–126)
Anion gap: 8 (ref 5–15)
BUN: 16 mg/dL (ref 6–20)
CO2: 25 mmol/L (ref 22–32)
Calcium: 9 mg/dL (ref 8.9–10.3)
Chloride: 104 mmol/L (ref 98–111)
Creatinine, Ser: 1.09 mg/dL (ref 0.61–1.24)
GFR, Estimated: >60 mL/min (ref >60)
Glucose, Bld: 102 mg/dL — ABNORMAL HIGH (ref 70–99)
Potassium: 3.1 mmol/L — ABNORMAL LOW (ref 3.5–5.1)
Sodium: 137 mmol/L (ref 135–145)
Total Bilirubin: 0.6 mg/dL (ref 0.3–1.2)
Total Protein: 6.7 g/dL (ref 6.5–8.1)

## 2021-04-04 LAB — LACTIC ACID, PLASMA: Lactic Acid, Venous: 1.5 mmol/L (ref 0.5–1.9)

## 2021-04-04 MED ORDER — CLINDAMYCIN HCL 150 MG PO CAPS
450.0000 mg | ORAL_CAPSULE | Freq: Three times a day (TID) | ORAL | 0 refills | Status: AC
  Filled 2021-04-04: qty 63, 7d supply, fill #0

## 2021-04-04 MED ORDER — SODIUM CHLORIDE 0.9 % IV BOLUS
1000.0000 mL | Freq: Once | INTRAVENOUS | Status: AC
  Administered 2021-04-04: 1000 mL via INTRAVENOUS

## 2021-04-04 MED ORDER — CLINDAMYCIN PHOSPHATE 600 MG/50ML IV SOLN
600.0000 mg | Freq: Once | INTRAVENOUS | Status: AC
Start: 2021-04-04 — End: 2021-04-04
  Administered 2021-04-04: 600 mg via INTRAVENOUS
  Filled 2021-04-04: qty 50

## 2021-04-04 NOTE — ED Provider Notes (Signed)
[facility] EMERGENCY DEPARTMENT Provider Note   CSN: [id number] Arrival date & time: 04/04/21  1423     History Chief Complaint  Patient presents with  . Arm Problem    Parker Cisneros is a 55 y.o. male.  The history is provided by the patient and medical records.   Parker Cisneros is a 55 y.o. male who presents to the Emergency Department complaining of arm pain. He presents the emergency department complaining of pain to the right elbow that started yesterday evening. At first it started like a pimple to the outer aspect of the elbow. Today he noticed that the pain significantly worsened there is increased swelling and he has discomfort spreading up to his upper arm. No reports of insect bites. He denies any fevers, nausea, chest pain, difficulty breathing. He has not noticed any drainage from this area. He does take is relative for history of DVT and PE. No prior similar symptoms. He is not diabetic. He is right-hand dominant.    Past Medical History:  Diagnosis Date  . Aspiration pneumonia (HCC)   . Chronic pain   . DVT (deep venous thrombosis) (HCC)   . Kidney stone   . Pulmonary emboli (HCC)     There are no problems to display for this patient.   Past Surgical History:  Procedure Laterality Date  . ABDOMINAL SURGERY    . GASTRIC BYPASS    . KIDNEY STONE SURGERY    . KNEE SURGERY         No family history on file.  Social History   Tobacco Use  . Smoking status: Never Smoker  . Smokeless tobacco: Never Used  Vaping Use  . Vaping Use: Never used  Substance Use Topics  . Alcohol use: Yes    Comment: rare  . Drug use: No    Home Medications Prior to Admission medications   Medication Sig Start Date End Date Taking? Authorizing Provider  cholecalciferol (VITAMIN D3) 25 MCG (1000 UNIT) tablet Take 1,000 Units by mouth daily.   Yes [provider]  clindamycin (CLEOCIN) 150 MG capsule Take 3 capsules (450 mg total) by mouth 3  (three) times daily. 04/04/21  Yes Tilden Fossa, MD  furosemide (LASIX) 20 MG tablet Take 20 mg by mouth.   Yes [provider]  HYDROcodone-acetaminophen (NORCO) 5-325 MG tablet Take 1 tablet by mouth every 4 (four) hours as needed. 02/04/18  Yes Collene Gobble, MD  magnesium 30 MG tablet Take 30 mg by mouth 2 (two) times daily.   Yes [provider]  methocarbamol (ROBAXIN) 750 MG tablet Take 750 mg by mouth 4 (four) times daily.   Yes [provider]  phentermine 37.5 MG capsule Take 37.5 mg by mouth every morning.   Yes [provider]  rivaroxaban (XARELTO) 20 MG TABS tablet Take 20 mg by mouth daily with supper.   Yes [provider]  zinc gluconate 50 MG tablet Take 50 mg by mouth daily.   Yes [provider]    Allergies    Minocycline  Review of Systems   Review of Systems  All other systems reviewed and are negative.   Physical Exam Updated Vital Signs BP 109/79 (BP Location: Right Arm)   Pulse 86   Temp 97.7 F (36.5 C) (Oral)   Resp 18   Ht 6' (1.829 m)   Wt (!) 139.7 kg   SpO2 100%   BMI 41.77 kg/m   Physical Exam  Vitals and nursing note reviewed.  Constitutional:      Appearance: He is well-developed.  HENT:     Head: Normocephalic and atraumatic.  Cardiovascular:     Rate and Rhythm: Regular rhythm. Tachycardia present.     Heart sounds: No murmur heard.   Pulmonary:     Effort: Pulmonary effort is normal. No respiratory distress.     Breath sounds: Normal breath sounds.  Abdominal:     Palpations: Abdomen is soft.     Tenderness: There is no abdominal tenderness. There is no guarding or rebound.  Musculoskeletal:     Comments: There is moderate soft tissue swelling and erythema over the outer aspect of the right elbow. There is one pinpoint nodule in the center of this area. There is no significant swelling to the bursa. Range of motion is intact throughout the joint. Erythema and edema extends to  the proximal third of the forearm and the distal third of the upper arm.  Skin:    General: Skin is warm and dry.  Neurological:     Mental Status: He is alert and oriented to person, place, and time.     Comments: Five out of five grip strength in the right upper extremity  Psychiatric:        Behavior: Behavior normal.     ED Results / Procedures / Treatments   Labs (all labs ordered are listed, but only abnormal results are displayed) Labs Reviewed  COMPREHENSIVE METABOLIC PANEL - Abnormal; Notable for the following components:      Result Value   Potassium 3.1 (*)    Glucose, Bld 102 (*)    All other components within normal limits  CBC WITH DIFFERENTIAL/PLATELET - Abnormal; Notable for the following components:   WBC 16.2 (*)    Neutro Abs 13.0 (*)    Monocytes Absolute 1.5 (*)    Abs Immature Granulocytes 0.08 (*)    All other components within normal limits  CULTURE, BLOOD (ROUTINE X 2)  CULTURE, BLOOD (ROUTINE X 2)  LACTIC ACID, PLASMA    EKG None  Radiology No results found.  Procedures Procedures   Medications Ordered in ED Medications  sodium chloride 0.9 % bolus 1,000 mL (1,000 mLs Intravenous New Bag/Given 04/04/21 1541)  clindamycin (CLEOCIN) IVPB 600 mg (0 mg Intravenous Stopped 04/04/21 1617)    ED Course  I have reviewed the triage vital signs and the nursing notes.  Pertinent labs & imaging results that were available during my care of the patient were reviewed by me and considered in my medical decision making (see chart for details).    MDM Rules/Calculators/A&P                         patient here for evaluation of swelling and pain to the right elbow. Examination is consistent with cellulitis. There is no significant bursal swelling or tenderness concerning for septic bursitis. There is no evidence of septic arthritis. He was treated with IV fluids and IV antibiotics. Plan to start on oral antibiotics. Discussed with patient home care for  cellulitis with outpatient follow-up and return precautions.  Final Clinical Impression(s) / ED Diagnoses Final diagnoses:  Cellulitis of right upper extremity    Rx / DC Orders ED Discharge Orders         Ordered    clindamycin (CLEOCIN) 150 MG capsule  3 times daily        04/04/21 1637  Tilden Fossa, MD 04/04/21 [phone]

## 2021-04-04 NOTE — ED Triage Notes (Addendum)
Pt reports area to right elbow that "felt like a zit", painful-today swelling, redness and increase pain, chills-NAD-steady gait-states wife marked area ~930am

## 2021-04-04 NOTE — ED Notes (Signed)
Redness and swelling to right elbow that started yesterday as a small pimple, redness worsening, heat noted from site, pain now radiating into right shoulder and bicep

## 2021-04-05 LAB — CULTURE, BLOOD (ROUTINE X 2)

## 2021-04-09 ENCOUNTER — Ambulatory Visit
Admission: RE | Admit: 2021-04-09 | Discharge: 2021-04-09 | Disposition: A | Discharge status: Home / Self Care | Payer: BLUE CROSS/BLUE SHIELD | Source: Ambulatory Visit | Attending: Orthopedic Surgery | Admitting: Orthopedic Surgery

## 2021-04-09 ENCOUNTER — Other Ambulatory Visit: Payer: Self-pay

## 2021-04-09 ENCOUNTER — Ambulatory Visit
Admission: RE | Admit: 2021-04-09 | Discharge: 2021-04-09 | Disposition: A | Discharge status: Home / Self Care | Payer: BC Managed Care – PPO | Source: Ambulatory Visit | Attending: Orthopedic Surgery | Admitting: Orthopedic Surgery

## 2021-04-09 DIAGNOSIS — M5412 Radiculopathy, cervical region: Secondary | ICD-10-CM

## 2021-04-09 DIAGNOSIS — M5416 Radiculopathy, lumbar region: Secondary | ICD-10-CM

## 2021-04-09 LAB — CULTURE, BLOOD (ROUTINE X 2)
Culture: NO GROWTH
Culture: NO GROWTH
Special Requests: ADEQUATE
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
## 2021-06-05 ENCOUNTER — Other Ambulatory Visit: Payer: Self-pay | Admitting: Orthopedic Surgery

## 2021-06-05 DIAGNOSIS — M25522 Pain in left elbow: Secondary | ICD-10-CM

## 2021-06-10 ENCOUNTER — Other Ambulatory Visit: Payer: Self-pay

## 2021-06-10 ENCOUNTER — Ambulatory Visit
Admission: RE | Admit: 2021-06-10 | Discharge: 2021-06-10 | Disposition: A | Discharge status: Home / Self Care | Payer: BC Managed Care – PPO | Source: Ambulatory Visit | Attending: Orthopedic Surgery | Admitting: Orthopedic Surgery

## 2021-06-10 DIAGNOSIS — M25522 Pain in left elbow: Secondary | ICD-10-CM

## 2021-06-10 IMAGING — MR MR ELBOW*L* W/O CM
4 of 5 series · 19 of 40 positions shown · non-contrast
Comparison: None.

CLINICAL DATA: Right medial elbow pain.

EXAM:
MRI OF THE LEFT ELBOW WITHOUT CONTRAST
TECHNIQUE: Multiplanar, multisequence MR imaging of the elbow was performed. No
intravenous contrast was administered.

[Series 5: T2 fat-sat · coronal · 3.0mm · 0.35mm/px · 5 of 23 slices shown (1 of 2)]
[im 1/23]
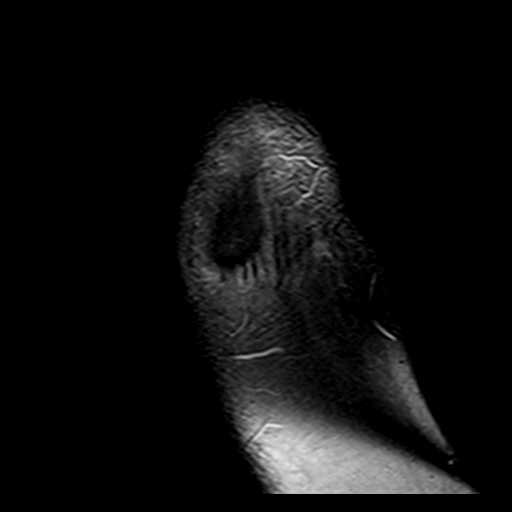
[im 4/23]
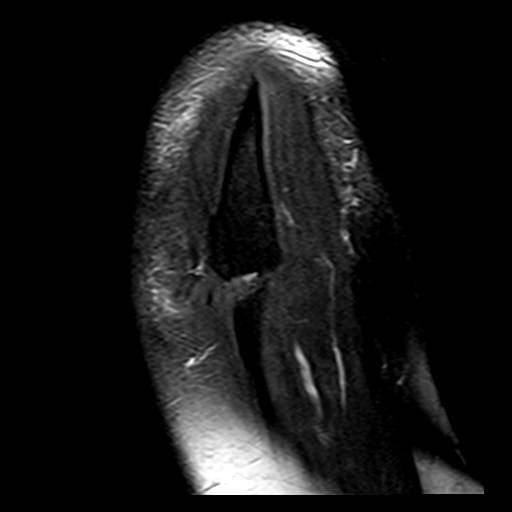
[im 8/23]
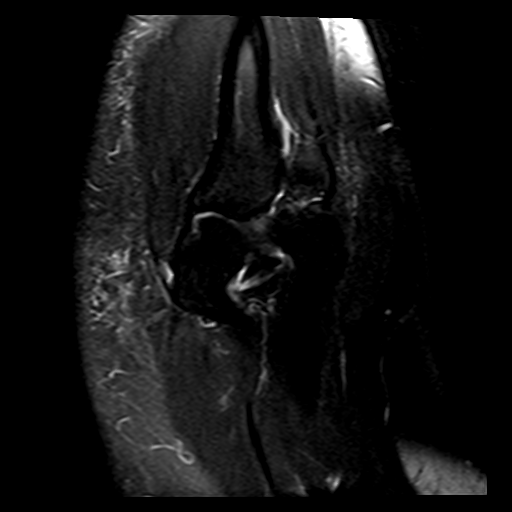
[im 12/23]
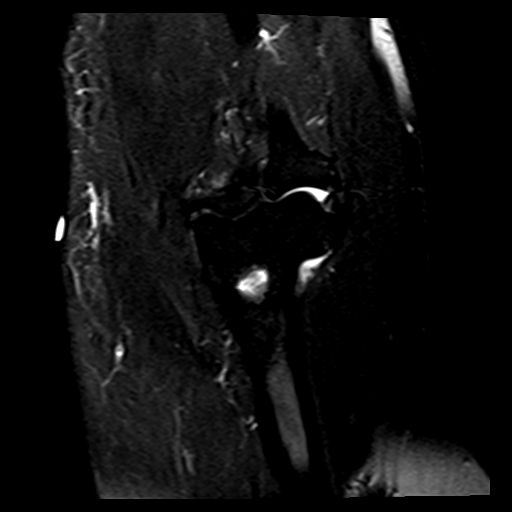
[im 19/23]
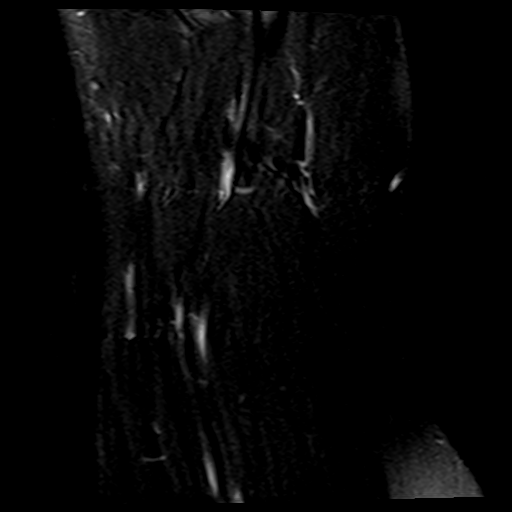

[Series 7: PD fat-sat · sagittal · 4.0mm · 0.35mm/px · 8 of 27 slices shown]
[im 1/27]
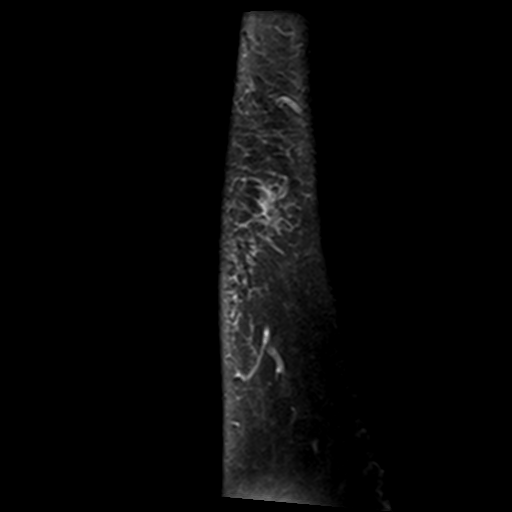
[im 4/27]
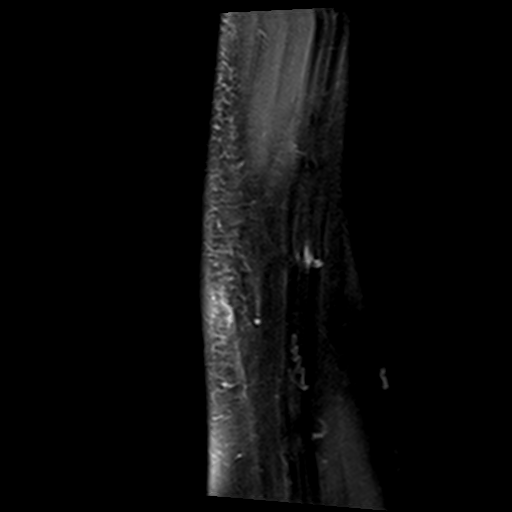
[im 8/27]
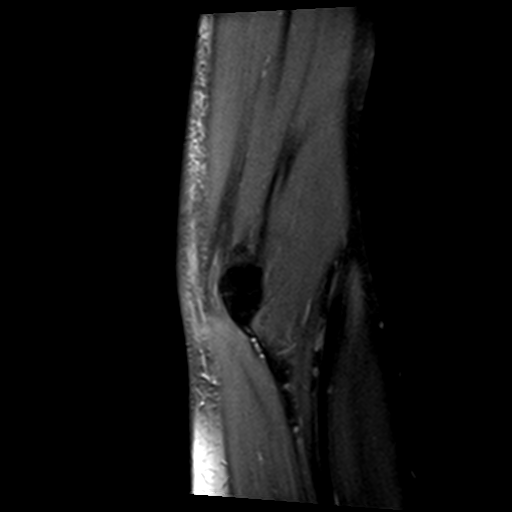
[im 12/27]
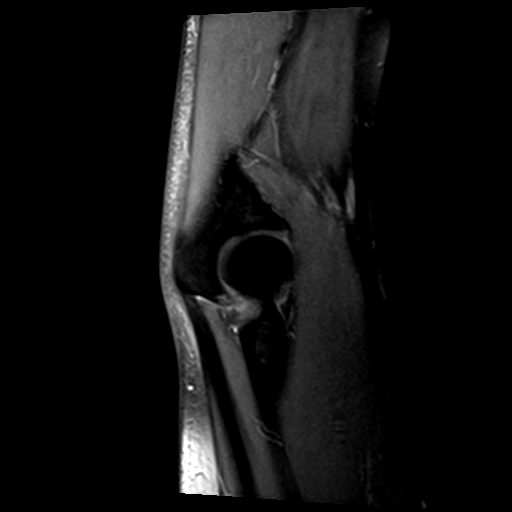
[im 15/27]
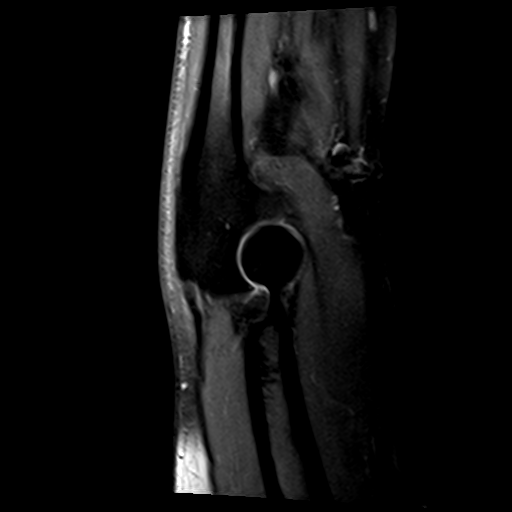
[im 19/27]
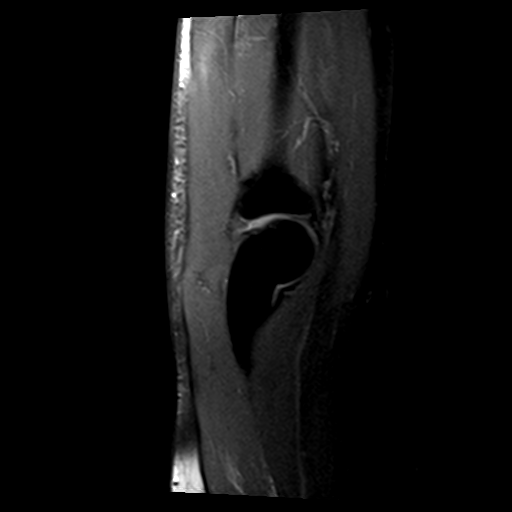
[im 23/27]
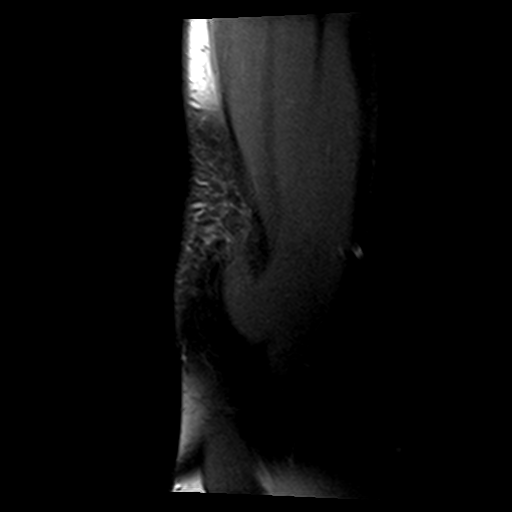
[im 27/27]
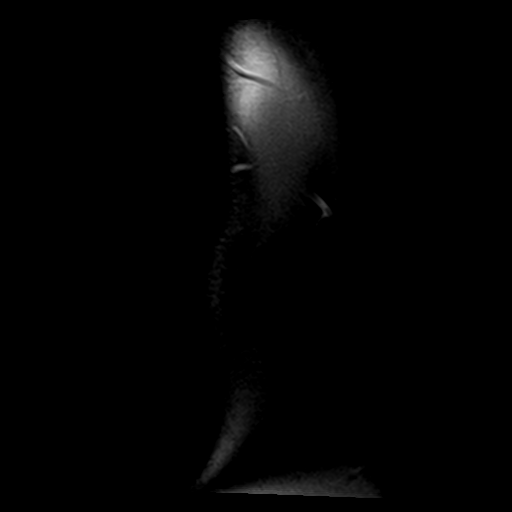

[Series 8: T1 · axial · 4.0mm · 0.29mm/px · z∈[-67,+18]mm · 3 of 28 slices shown]
[im 4/28]
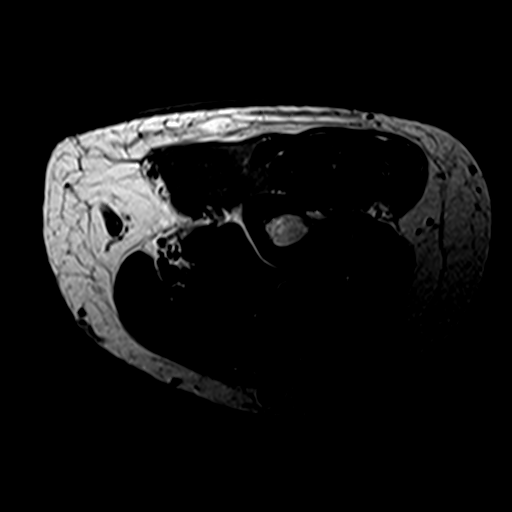
[im 14/28]
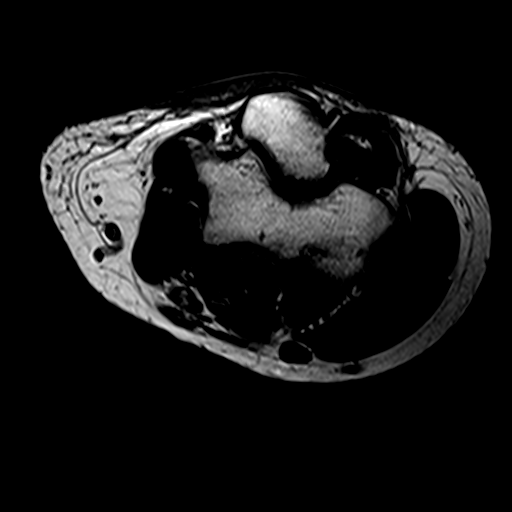
[im 24/28]
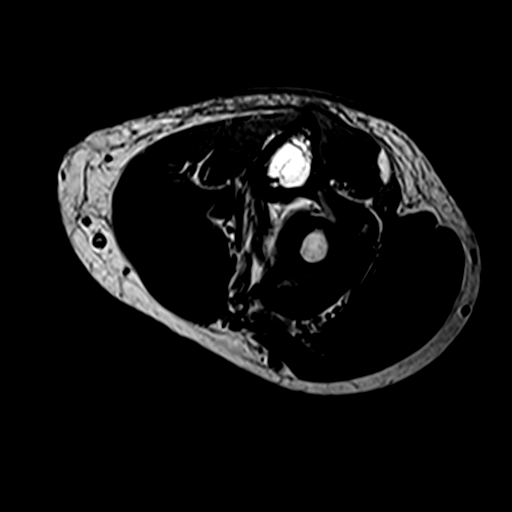

[Series 9: T2 fat-sat · axial · 4.0mm · 0.29mm/px · z∈[-67,+18]mm · 3 of 28 slices shown (2 of 2)]
[im 4/28]
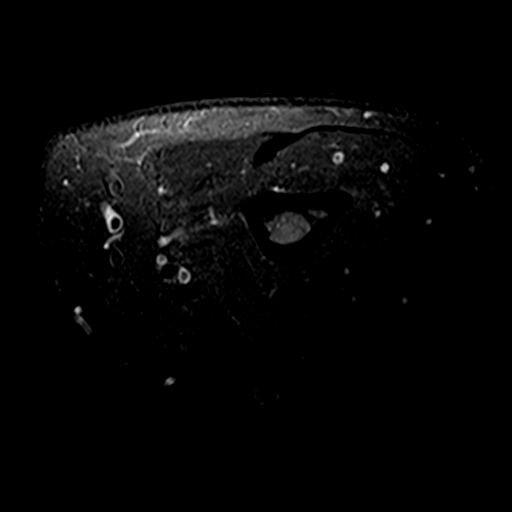
[im 14/28]
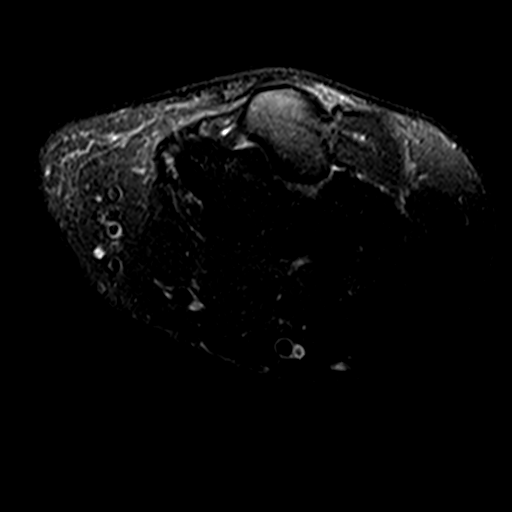
[im 24/28]
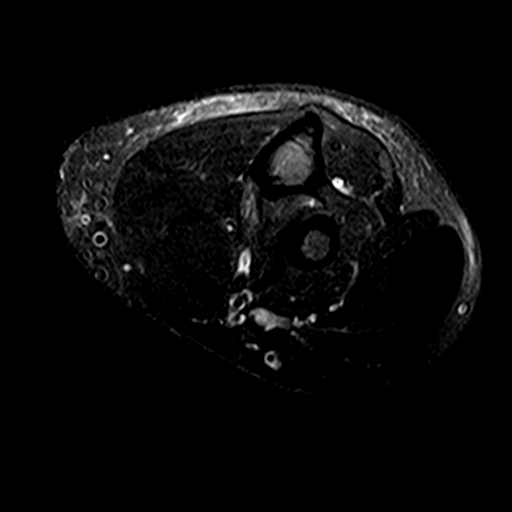

[19 of 40 positions shown; findings below may reference images not displayed]

FINDINGS: TENDONS

Common forearm flexor origin: Mild tendinosis of the common flexor
tendon origin with a small interstitial tear.

Common forearm extensor origin: Intact

Biceps: Intact

Triceps: Intact

LIGAMENTS

Medial stabilizers: Intact

Lateral stabilizers:  Intact

Cartilage: No chondral defect.

Joint: No joint effusion. No synovitis.

Cubital tunnel: Normal.

Bones: No fracture or dislocation. No marrow abnormality.

Soft Tissues: Muscles are normal. No fluid collection or hematoma.
IMPRESSION: 1. Mild tendinosis of the common flexor tendon origin with a small
interstitial tear.
